# Patient Record
Sex: Male | Born: 1960 | Race: White | Hispanic: No | Marital: Married | Smoking: Former smoker
Health system: Southern US, Community
[De-identification: ages and names within clinical notes are randomized; demographics above are authoritative.]

## PROBLEM LIST (undated history)

## (undated) DIAGNOSIS — C801 Malignant (primary) neoplasm, unspecified: Secondary | ICD-10-CM

## (undated) DIAGNOSIS — F329 Major depressive disorder, single episode, unspecified: Secondary | ICD-10-CM

## (undated) DIAGNOSIS — J45909 Unspecified asthma, uncomplicated: Secondary | ICD-10-CM

## (undated) DIAGNOSIS — R519 Headache, unspecified: Secondary | ICD-10-CM

## (undated) DIAGNOSIS — N419 Inflammatory disease of prostate, unspecified: Secondary | ICD-10-CM

## (undated) DIAGNOSIS — I10 Essential (primary) hypertension: Secondary | ICD-10-CM

## (undated) DIAGNOSIS — K219 Gastro-esophageal reflux disease without esophagitis: Secondary | ICD-10-CM

## (undated) DIAGNOSIS — T7840XA Allergy, unspecified, initial encounter: Secondary | ICD-10-CM

## (undated) DIAGNOSIS — D8989 Other specified disorders involving the immune mechanism, not elsewhere classified: Secondary | ICD-10-CM

## (undated) DIAGNOSIS — Z87442 Personal history of urinary calculi: Secondary | ICD-10-CM

## (undated) DIAGNOSIS — Z973 Presence of spectacles and contact lenses: Secondary | ICD-10-CM

## (undated) DIAGNOSIS — B019 Varicella without complication: Secondary | ICD-10-CM

## (undated) DIAGNOSIS — F32A Depression, unspecified: Secondary | ICD-10-CM

## (undated) DIAGNOSIS — F419 Anxiety disorder, unspecified: Secondary | ICD-10-CM

## (undated) DIAGNOSIS — N189 Chronic kidney disease, unspecified: Secondary | ICD-10-CM

## (undated) HISTORY — DX: Inflammatory disease of prostate, unspecified: N41.9

## (undated) HISTORY — DX: Varicella without complication: B01.9

## (undated) HISTORY — DX: Anxiety disorder, unspecified: F41.9

## (undated) HISTORY — PX: LITHOTRIPSY: SUR834

## (undated) HISTORY — PX: UPPER GASTROINTESTINAL ENDOSCOPY: SHX188

## (undated) HISTORY — DX: Gastro-esophageal reflux disease without esophagitis: K21.9

## (undated) HISTORY — DX: Major depressive disorder, single episode, unspecified: F32.9

## (undated) HISTORY — DX: Chronic kidney disease, unspecified: N18.9

## (undated) HISTORY — DX: Malignant (primary) neoplasm, unspecified: C80.1

## (undated) HISTORY — DX: Depression, unspecified: F32.A

## (undated) HISTORY — DX: Allergy, unspecified, initial encounter: T78.40XA

## (undated) HISTORY — DX: Essential (primary) hypertension: I10

## (undated) HISTORY — DX: Unspecified asthma, uncomplicated: J45.909

## (undated) HISTORY — PX: OTHER SURGICAL HISTORY: SHX169

---

## 2000-10-13 LAB — HM COLONOSCOPY

## 2010-10-14 ENCOUNTER — Ambulatory Visit (INDEPENDENT_AMBULATORY_CARE_PROVIDER_SITE_OTHER): Payer: BC Managed Care – PPO | Admitting: Family Medicine

## 2010-10-14 ENCOUNTER — Encounter: Payer: Self-pay | Admitting: Family Medicine

## 2010-10-14 DIAGNOSIS — Z8659 Personal history of other mental and behavioral disorders: Secondary | ICD-10-CM

## 2010-10-14 DIAGNOSIS — Z800 Family history of malignant neoplasm of digestive organs: Secondary | ICD-10-CM

## 2010-10-14 DIAGNOSIS — N200 Calculus of kidney: Secondary | ICD-10-CM

## 2010-10-14 DIAGNOSIS — I10 Essential (primary) hypertension: Secondary | ICD-10-CM

## 2010-10-14 DIAGNOSIS — K219 Gastro-esophageal reflux disease without esophagitis: Secondary | ICD-10-CM

## 2010-10-14 MED ORDER — HYDROCODONE-IBUPROFEN 10-200 MG PO TABS: 1.0000 | ORAL_TABLET | Freq: Four times a day (QID) | ORAL | Status: DC

## 2010-10-14 MED ORDER — AMLODIPINE BESYLATE 10 MG PO TABS: 10.0000 mg | ORAL_TABLET | Freq: Every day | ORAL | Status: DC

## 2010-10-14 NOTE — Progress Notes (Signed)
Subjective:    Patient ID: Donald Clark, male    DOB: 1960/11/16, 50 y.o.   MRN: [id number]  HPI Patient seen to establish care. Past medical history reviewed. He has history of remote depression, GERD, hypertension, kidney stones, and recurrent prostatitis. Patient states he has a couple of large stones still present. He has intermittent bleeding (hematuria) especially with activities. Needs to establish with local urologist. He is not aware of any prior history of hydronephrosis. Do not have complete prior records at this time.  Strong family history of colon cancer both parents. Patient had colonoscopy 2002. Mother diagnosed age 48 and father at age 39. No polyps on previous colonoscopy. Needs to establish with local gastroenterologist.  History of recent weight gain. Avid cyclist in the past but no consistent exercise recently. Increased fatigue and increased body fat composition. Patient concern regarding possible low testosterone. Never checked. Libido is fair.  Family history significant for colon cancer in both parents as above. Mom had ovarian cancer and patient thinks this was metastatic from colon. Paternal grandmother and maternal grandfather with type 2 diabetes.  Patient is married with 2 children. Nonsmoker. Occasional alcohol use.  Past Medical History  Diagnosis Date  . Chicken pox   . Depression   . GERD (gastroesophageal reflux disease)   . Allergy   . Hypertension   . Chronic kidney disease     stones  . Prostatitis    History reviewed. No pertinent past surgical history.  reports that he quit smoking about 30 years ago. He does not have any smokeless tobacco history on file. His alcohol and drug histories not on file. family history includes Cancer in his mother; Diabetes in his maternal grandfather and paternal grandmother; and Hypertension in his mother. Allergies  Allergen Reactions  . Penicillins     GI upset  . Sulfa Antibiotics     Hives, joint pain       Review of Systems  Constitutional: Positive for fatigue and unexpected weight change (weight gain). Negative for fever, activity change and appetite change.  HENT: Negative for ear pain, congestion and trouble swallowing.   Eyes: Negative for pain and visual disturbance.  Respiratory: Negative for cough, shortness of breath and wheezing.   Cardiovascular: Negative for chest pain and palpitations.  Gastrointestinal: Negative for nausea, vomiting, abdominal pain, diarrhea, constipation, blood in stool, abdominal distention and rectal pain.  Genitourinary: Negative for dysuria, hematuria and testicular pain.  Musculoskeletal: Negative for joint swelling and arthralgias.  Skin: Negative for rash.  Neurological: Negative for dizziness, syncope and headaches.  Hematological: Negative for adenopathy.  Psychiatric/Behavioral: Negative for confusion and dysphoric mood.       Objective:   Physical Exam  Constitutional: He is oriented to person, place, and time. He appears well-developed and well-nourished. No distress.  HENT:  Right Ear: External ear normal.  Left Ear: External ear normal.  Mouth/Throat: Oropharynx is clear and moist. No oropharyngeal exudate.  Neck: Neck supple. No thyromegaly present.  Cardiovascular: Normal rate and regular rhythm.   No murmur heard. Pulmonary/Chest: Effort normal and breath sounds normal. No respiratory distress. He has no wheezes. He has no rales.  Abdominal: Soft. He exhibits no distension and no mass. There is no tenderness. There is no rebound and no guarding.  Musculoskeletal: He exhibits no edema.  Lymphadenopathy:    He has no cervical adenopathy.  Neurological: He is alert and oriented to person, place, and time. No cranial nerve deficit.  Skin: No rash noted.  Psychiatric: He has a normal mood and affect. His behavior is normal.          Assessment & Plan:  #1 hypertension stable continue current medication  #2 recent weight  gain. Add testosterone to typical labs for complete physical and schedule by next month #3 strong family history of colon cancer both parents. Schedule followup with local gastroenterologist for repeat colonoscopy #4 recurrent kidney stones. Intermittent gross hematuria. Schedule with local urologist. #5 history of depression currently stable #6 history of GERD symptoms relatively mild and controlled with over-the-counter antacids

## 2010-10-15 ENCOUNTER — Ambulatory Visit: Payer: BC Managed Care – PPO

## 2010-10-15 DIAGNOSIS — Z0389 Encounter for observation for other suspected diseases and conditions ruled out: Secondary | ICD-10-CM

## 2010-10-15 DIAGNOSIS — Z0000 Encounter for general adult medical examination without abnormal findings: Secondary | ICD-10-CM

## 2010-10-15 LAB — CBC WITH DIFFERENTIAL/PLATELET
Basophils Absolute: 0 10*3/uL (ref 0.0–0.1)
Basophils Relative: 0.8 % (ref 0.0–3.0)
Eosinophils Absolute: 0.1 10*3/uL (ref 0.0–0.7)
Eosinophils Relative: 2 % (ref 0.0–5.0)
HCT: 43.6 % (ref 39.0–52.0)
Hemoglobin: 14.8 g/dL (ref 13.0–17.0)
Lymphocytes Relative: 23.2 % (ref 12.0–46.0)
Lymphs Abs: 1 10*3/uL (ref 0.7–4.0)
MCHC: 34 g/dL (ref 30.0–36.0)
MCV: 85.5 fl (ref 78.0–100.0)
Monocytes Absolute: 0.4 10*3/uL (ref 0.1–1.0)
Monocytes Relative: 8.5 % (ref 3.0–12.0)
Neutro Abs: 2.9 10*3/uL (ref 1.4–7.7)
Neutrophils Relative %: 65.5 % (ref 43.0–77.0)
Platelets: 215 10*3/uL (ref 150.0–400.0)
RBC: 5.09 Mil/uL (ref 4.22–5.81)
RDW: 13 % (ref 11.5–14.6)
WBC: 4.5 10*3/uL (ref 4.5–10.5)

## 2010-10-15 LAB — URINALYSIS, ROUTINE W REFLEX MICROSCOPIC
Bilirubin Urine: NEGATIVE
Ketones, ur: NEGATIVE
Leukocytes, UA: NEGATIVE
Nitrite: NEGATIVE
Specific Gravity, Urine: 1.02 (ref 1.000–1.030)
Total Protein, Urine: 100
Urine Glucose: NEGATIVE
Urobilinogen, UA: 0.2 (ref 0.0–1.0)
pH: 7 (ref 5.0–8.0)

## 2010-10-15 LAB — BASIC METABOLIC PANEL
BUN: 19 mg/dL (ref 6–23)
CO2: 29 mEq/L (ref 19–32)
Calcium: 9.5 mg/dL (ref 8.4–10.5)
Chloride: 109 mEq/L (ref 96–112)
Creatinine, Ser: 1 mg/dL (ref 0.4–1.5)
GFR: 84.78 mL/min (ref >60.00)
Glucose, Bld: 90 mg/dL (ref 70–99)
Potassium: 4.4 mEq/L (ref 3.5–5.1)
Sodium: 143 mEq/L (ref 135–145)

## 2010-10-15 LAB — HEPATIC FUNCTION PANEL
ALT: 24 U/L (ref 0–53)
AST: 22 U/L (ref 0–37)
Albumin: 4.5 g/dL (ref 3.5–5.2)
Alkaline Phosphatase: 69 U/L (ref 39–117)
Bilirubin, Direct: 0.1 mg/dL (ref 0.0–0.3)
Total Bilirubin: 0.7 mg/dL (ref 0.3–1.2)
Total Protein: 6.8 g/dL (ref 6.0–8.3)

## 2010-10-15 LAB — LIPID PANEL
Cholesterol: 174 mg/dL (ref 0–200)
HDL: 50.8 mg/dL (ref >39.00)
LDL Cholesterol: 108 mg/dL — ABNORMAL HIGH (ref 0–99)
Total CHOL/HDL Ratio: 3
Triglycerides: 75 mg/dL (ref 0.0–149.0)
VLDL: 15 mg/dL (ref 0.0–40.0)

## 2010-10-15 LAB — TSH: TSH: 1.33 u[IU]/mL (ref 0.35–5.50)

## 2010-10-15 LAB — PSA: PSA: 0.75 ng/mL (ref 0.10–4.00)

## 2010-11-13 ENCOUNTER — Ambulatory Visit (INDEPENDENT_AMBULATORY_CARE_PROVIDER_SITE_OTHER): Payer: BC Managed Care – PPO | Admitting: Family Medicine

## 2010-11-13 ENCOUNTER — Encounter: Payer: Self-pay | Admitting: Family Medicine

## 2010-11-13 VITALS — BP 124/90 | HR 72 | Temp 98.5°F | Resp 12 | Ht 66.5 in | Wt 179.0 lb

## 2010-11-13 DIAGNOSIS — L989 Disorder of the skin and subcutaneous tissue, unspecified: Secondary | ICD-10-CM

## 2010-11-13 DIAGNOSIS — Z0000 Encounter for general adult medical examination without abnormal findings: Secondary | ICD-10-CM

## 2010-11-13 DIAGNOSIS — R5383 Other fatigue: Secondary | ICD-10-CM

## 2010-11-13 DIAGNOSIS — R5381 Other malaise: Secondary | ICD-10-CM

## 2010-11-13 MED ORDER — ZOLPIDEM TARTRATE 10 MG PO TABS: 10.0000 mg | ORAL_TABLET | Freq: Every evening | ORAL | Status: DC | PRN

## 2010-11-13 MED ORDER — FLUTICASONE PROPIONATE 50 MCG/ACT NA SUSP: 2.0000 | Freq: Every day | NASAL | Status: DC

## 2010-11-13 NOTE — Patient Instructions (Signed)
Schedule testosterone level We will call you regarding appointment for [facility] Cialis 5 mg one daily

## 2010-11-13 NOTE — Progress Notes (Signed)
Subjective:    Patient ID: Donald Clark, male    DOB: 12/19/1960, 50 y.o.   MRN: [id number]  HPI Patient here for complete physical. Has history of kidney stones and scheduled lithotripsy this Monday. Other medical problems include history of hypertension, GERD, and past history of depression. He takes amlodipine for hypertension. Blood pressure generally well controlled. Intermittent insomnia takes Ambien for that. Seasonal allergies and has used Flonase and requesting refills.  Has had difficulty losing weight in recent years. Still exercises regularly. Increased body fat. Increased fatigue. Occasional erectile dysfunction. He requested testosterone level be checked-but this was not drawn. Also has occasional slow urinary stream. Requesting trial of Cialis.  Tetanus up-to-date. Flu vaccine through employer.  Family history significant for both parents with colon cancer. Patient had last colonoscopy about 10 years ago and has been contacted by gastroenterology and plans to schedule colonoscopy after upcoming lithotripsy.  Past Medical History  Diagnosis Date  . Chicken pox   . Depression   . GERD (gastroesophageal reflux disease)   . Allergy   . Hypertension   . Chronic kidney disease     stones  . Prostatitis    No past surgical history on file.  reports that he quit smoking about 30 years ago. He does not have any smokeless tobacco history on file. His alcohol and drug histories not on file. family history includes Cancer in his mother; Cancer (age of onset:63) in his father; Diabetes in his maternal grandfather and paternal grandmother; and Hypertension in his mother. Allergies  Allergen Reactions  . Penicillins     GI upset  . Sulfa Antibiotics     Hives, joint pain      Review of Systems  Constitutional: Positive for fatigue. Negative for fever, activity change and appetite change.  HENT: Negative for ear pain, congestion and trouble swallowing.   Eyes: Negative for pain  and visual disturbance.  Respiratory: Negative for cough, shortness of breath and wheezing.   Cardiovascular: Negative for chest pain and palpitations.  Gastrointestinal: Negative for nausea, vomiting, abdominal pain, diarrhea, constipation, blood in stool, abdominal distention and rectal pain.  Genitourinary: Negative for dysuria, hematuria and testicular pain.  Musculoskeletal: Negative for joint swelling and arthralgias.  Skin: Positive for rash.  Neurological: Negative for dizziness, syncope and headaches.  Hematological: Negative for adenopathy.  Psychiatric/Behavioral: Negative for confusion and dysphoric mood.       Objective:   Physical Exam  Constitutional: He is oriented to person, place, and time. He appears well-developed and well-nourished.  HENT:  Head: Normocephalic and atraumatic.  Right Ear: External ear normal.  Left Ear: External ear normal.  Mouth/Throat: Oropharynx is clear and moist.  Eyes: Pupils are equal, round, and reactive to light.  Neck: Neck supple. No thyromegaly present.  Cardiovascular: Normal rate, regular rhythm and normal heart sounds.   No murmur heard. Pulmonary/Chest: Effort normal and breath sounds normal. No respiratory distress. He has no wheezes. He has no rales.  Abdominal: Soft. Bowel sounds are normal. He exhibits no distension. There is no tenderness. There is no rebound and no guarding.  Genitourinary:       Prostate deferred as he is seeing urologist regularly  Musculoskeletal: He exhibits no edema.  Lymphadenopathy:    He has no cervical adenopathy.  Neurological: He is alert and oriented to person, place, and time. No cranial nerve deficit.  Skin:       Right upper back reveals slightly raised skin lesion which is approximately 15 by  8 mm. Slightly erythematous base. Few telangiectasias. No pigment  Psychiatric: He has a normal mood and affect. His behavior is normal. Judgment and thought content normal.          Assessment  & Plan:  #1 health maintenance. Labs reviewed with patient with no concerns. Tetanus up-to-date. Continue yearly flu vaccine #2 fatigue and decreased libido. Check early morning testosterone level. Trial Cialis 5 mg daily  #3 skin lesion right upper back.  This is a new problem.  Possible basal cell carcinoma. Referral skin surgery Center #4 hypertension stable continue amlodipine 10 mg daily #5 insomnia, transient. Refill Ambien for as needed use #6 family history of colon cancer both parents. Patient will schedule colonoscopy after lithotripsy

## 2010-11-17 ENCOUNTER — Ambulatory Visit (HOSPITAL_COMMUNITY)
Admission: RE | Admit: 2010-11-17 | Discharge: 2010-11-17 | Disposition: A | Discharge status: Home / Self Care | Payer: BC Managed Care – PPO | Source: Ambulatory Visit | Attending: Urology | Admitting: Urology

## 2010-11-17 DIAGNOSIS — K219 Gastro-esophageal reflux disease without esophagitis: Secondary | ICD-10-CM | POA: Insufficient documentation

## 2010-11-17 DIAGNOSIS — I10 Essential (primary) hypertension: Secondary | ICD-10-CM | POA: Insufficient documentation

## 2010-11-17 DIAGNOSIS — N200 Calculus of kidney: Secondary | ICD-10-CM | POA: Insufficient documentation

## 2010-12-08 ENCOUNTER — Encounter: Payer: Self-pay | Admitting: Gastroenterology

## 2010-12-29 ENCOUNTER — Ambulatory Visit (HOSPITAL_COMMUNITY)
Admission: RE | Admit: 2010-12-29 | Discharge: 2010-12-29 | Disposition: A | Discharge status: Home / Self Care | Payer: BC Managed Care – PPO | Source: Ambulatory Visit | Attending: Urology | Admitting: Urology

## 2010-12-29 DIAGNOSIS — N200 Calculus of kidney: Secondary | ICD-10-CM | POA: Insufficient documentation

## 2010-12-29 DIAGNOSIS — Z01818 Encounter for other preprocedural examination: Secondary | ICD-10-CM | POA: Insufficient documentation

## 2010-12-30 ENCOUNTER — Encounter: Payer: Self-pay | Admitting: Gastroenterology

## 2010-12-30 ENCOUNTER — Ambulatory Visit (AMBULATORY_SURGERY_CENTER): Payer: BC Managed Care – PPO

## 2010-12-30 VITALS — Ht 67.0 in | Wt 177.3 lb

## 2010-12-30 DIAGNOSIS — Z800 Family history of malignant neoplasm of digestive organs: Secondary | ICD-10-CM

## 2010-12-30 MED ORDER — PEG-KCL-NACL-NASULF-NA ASC-C 100 G PO SOLR: 1.0000 | Freq: Once | ORAL | Status: AC

## 2010-12-30 NOTE — Progress Notes (Signed)
Pt came into office today for his pre-visit prior to his colonoscopy on 01/13/11 with Dr Christella Hartigan. Pt had a colonoscopy done 10 years ago in [place], [place] but doesn't remember the doctors name or results. There is a family hx of colon cancer. Ulis Rias RN

## 2011-02-05 ENCOUNTER — Other Ambulatory Visit: Payer: BC Managed Care – PPO

## 2011-02-12 ENCOUNTER — Telehealth: Payer: Self-pay | Admitting: Gastroenterology

## 2011-02-12 NOTE — Telephone Encounter (Signed)
No charge as long as he reschedules. Thanks

## 2011-02-13 ENCOUNTER — Ambulatory Visit (INDEPENDENT_AMBULATORY_CARE_PROVIDER_SITE_OTHER): Payer: BC Managed Care – PPO | Admitting: Family Medicine

## 2011-02-13 ENCOUNTER — Encounter: Payer: Self-pay | Admitting: Family Medicine

## 2011-02-13 ENCOUNTER — Other Ambulatory Visit: Payer: BC Managed Care – PPO | Admitting: Gastroenterology

## 2011-02-13 VITALS — BP 126/90 | HR 114 | Temp 99.2°F | Wt 176.0 lb

## 2011-02-13 DIAGNOSIS — H1045 Other chronic allergic conjunctivitis: Secondary | ICD-10-CM

## 2011-02-13 DIAGNOSIS — H1013 Acute atopic conjunctivitis, bilateral: Secondary | ICD-10-CM

## 2011-02-13 DIAGNOSIS — N400 Enlarged prostate without lower urinary tract symptoms: Secondary | ICD-10-CM

## 2011-02-13 MED ORDER — AZELASTINE HCL 0.05 % OP SOLN: 1.0000 [drp] | Freq: Two times a day (BID) | OPHTHALMIC | Status: DC

## 2011-02-13 MED ORDER — TADALAFIL 5 MG PO TABS: 5.0000 mg | ORAL_TABLET | Freq: Every day | ORAL | Status: DC

## 2011-02-13 MED ORDER — CETIRIZINE HCL 10 MG PO TABS: 10.0000 mg | ORAL_TABLET | Freq: Every day | ORAL | Status: DC

## 2011-02-13 NOTE — Patient Instructions (Signed)

## 2011-02-13 NOTE — Progress Notes (Signed)
Subjective:    Patient ID: Donald Clark, male    DOB: August 02, 1960, 50 y.o.   MRN: [id number]  HPI 50 year old white male, former smoker, and with a one-day history of itchy eyes redness and burning. Has had some nasal congestion and nonproductive cough. His knees have not seen for relief has not working well. Denies any matting or crusting, no headaches, lightheadedness, or dizziness.  He has a history of BPH and erectile dysfunction. He typically takes Cialis 5 mg daily. He is requesting a sample pack. He is tolerating medications well. Denies any frequency or urgency to urinate, no burning with urination, back pain, nausea, vomiting, or edema.  Review of Systems As stated above    Past Medical History  Diagnosis Date  . Chicken pox   . Depression   . GERD (gastroesophageal reflux disease)   . Allergy   . Hypertension   . Chronic kidney disease     stones  . Prostatitis     History   Social History  . Marital Status: Married    Spouse Name: N/A    Number of Children: N/A  . Years of Education: N/A   Occupational History  . Not on file.   Social History Main Topics  . Smoking status: Former Smoker -- 0.5 packs/day for 4 years    Types: Cigarettes    Quit date: 10/13/1980  . Smokeless tobacco: Never Used  . Alcohol Use: 0.5 oz/week    1 drink(s) per week  . Drug Use: No  . Sexually Active: Not on file   Other Topics Concern  . Not on file   Social History Narrative  . No narrative on file    Past Surgical History  Procedure Date  . Upper gastrointestinal endoscopy   . Colonoscopy   . Lithotripsy   . Skin cancer removed     on ankle, back neck    Family History  Problem Relation Age of Onset  . Cancer Mother     colon, uterine CA  . Hypertension Mother   . Colon cancer Mother   . Diabetes Maternal Grandfather   . Diabetes Paternal Grandmother   . Cancer Father 59    colon cancer  . Colon cancer Father     Allergies  Allergen Reactions  .  Penicillins     GI upset  . Sulfa Antibiotics     Hives, joint pain    Current Outpatient Prescriptions on File Prior to Visit  Medication Sig Dispense Refill  . amLODipine (NORVASC) 10 MG tablet Take 1 tablet (10 mg total) by mouth daily.  30 tablet  11  . fluticasone (FLONASE) 50 MCG/ACT nasal spray Place 2 sprays into the nose daily.  16 g  11  . HYDROcodone-acetaminophen (VICODIN) 5-500 MG per tablet Take 1 tablet by mouth every 6 (six) hours as needed.        . Hydrocodone-Ibuprofen 10-200 MG TABS Take 1 tablet by mouth QID.  20 each  0  . zolpidem (AMBIEN) 10 MG tablet Take 1 tablet (10 mg total) by mouth at bedtime as needed.  30 tablet  6    BP 126/90  Pulse 114  Temp(Src) 99.2 F (37.3 C) (Oral)  Wt 176 lb (79.833 kg)chart Objective:   Physical Exam Constitutional: Alert and oriented in no acute distress HEENT pupils equal and reactive to light and accommodation, conjunctiva slightly red and injected. Ear and normal pharynx normal normal dentition Neck: No lymphadenopathy, no thyromegaly masses or bruits Lungs:  Clear to auscultation Cardiac regular rate and rhythm, no murmurs rubs or gallops  Skin: Warm and dry, no cyanosis Psychiatric: Normal mood and affect       Assessment & Plan:  Assessment: Allergic conjunctivitis, benign prostatic hypertrophy, erectile dysfunction  Plan: Zyrtec over-the-counter when necessary, Optivar one drop in each eye daily. Cialis 5 mg once daily. Patient to call if symptoms worsen or persist recheck as scheduled, and when necessary

## 2011-03-05 ENCOUNTER — Telehealth: Payer: Self-pay | Admitting: Gastroenterology

## 2011-03-05 NOTE — Telephone Encounter (Signed)
No charge. 

## 2011-03-09 ENCOUNTER — Other Ambulatory Visit: Payer: BC Managed Care – PPO | Admitting: Gastroenterology

## 2011-03-09 ENCOUNTER — Encounter: Payer: Self-pay | Admitting: Gastroenterology

## 2011-03-09 NOTE — Telephone Encounter (Signed)
error 

## 2011-03-27 ENCOUNTER — Ambulatory Visit (AMBULATORY_SURGERY_CENTER): Payer: BC Managed Care – PPO | Admitting: Gastroenterology

## 2011-03-27 ENCOUNTER — Encounter: Payer: Self-pay | Admitting: Gastroenterology

## 2011-03-27 DIAGNOSIS — Z800 Family history of malignant neoplasm of digestive organs: Secondary | ICD-10-CM

## 2011-03-27 DIAGNOSIS — Z1211 Encounter for screening for malignant neoplasm of colon: Secondary | ICD-10-CM

## 2011-03-27 MED ORDER — SODIUM CHLORIDE 0.9 % IV SOLN: 500.0000 mL | INTRAVENOUS | Status: DC

## 2011-03-27 NOTE — Patient Instructions (Signed)
FOLLOW THE DISCHARGE INSTRUCTIONS ON THE BLUE AND GREEN INSTRUCTION SHEETS.  CONTINUE YOUR MEDICATIONS.  NEXT COLONOSCOPY IN 5 YEARS, 2018.

## 2011-03-27 NOTE — Progress Notes (Signed)
Pressure applied to abdomen to reach cecum.  

## 2011-03-27 NOTE — Op Note (Signed)
[facility] [address] [place], [place]  [address]  COLONOSCOPY PROCEDURE REPORT  PATIENT:  Donald Clark, Donald Clark  MR#:  [id number] BIRTHDATE:  1960/11/01, 50 yrs. old  GENDER:  male ENDOSCOPIST:  Rachael Fee, MD REF. BY:  Evelena Peat, M.D. PROCEDURE DATE:  03/27/2011 PROCEDURE:  Colonoscopy 62[address] ASA CLASS:  Class II INDICATIONS:  Elevated Risk Screening mother and father both with colorectal cancer MEDICATIONS:   Fentanyl 100 mcg IV, These medications were titrated to patient response per physician's verbal order, Versed 10 mg IV  DESCRIPTION OF PROCEDURE:   After the risks benefits and alternatives of the procedure were thoroughly explained, informed consent was obtained.  Digital rectal exam was performed and revealed no rectal masses.   The LB 180AL [id number] endoscope was introduced through the anus and advanced to the cecum, which was identified by both the appendix and ileocecal valve, without limitations.  The quality of the prep was good..  The instrument was then slowly withdrawn as the colon was fully examined. <<PROCEDUREIMAGES>> FINDINGS:  Internal Hemorrhoids were found.  This was otherwise a normal examination of the colon (see image1, image2, and image3). Retroflexed views in the rectum revealed no abnormalities. COMPLICATIONS:  None  ENDOSCOPIC IMPRESSION: 1) Internal hemorrhoids 2) Otherwise normal examination; no polyps or cancers  RECOMMENDATIONS: 1) Given your significant family history of colon cancer, you should have a repeat colonoscopy in 5 years  REPEAT EXAM:  5 years  ______________________________ Rachael Fee, MD  n. eSIGNED:   Rachael Fee at 03/27/2011 02:01 PM  Jobe Marker, [phone]

## 2011-03-27 NOTE — Progress Notes (Signed)
Awoke on coming out of cecum and explained that this section of the colon has a lot of nerves, tolerating well.

## 2011-03-27 NOTE — Progress Notes (Signed)
Patient did not experience any of the following events: a burn prior to discharge; a fall within the facility; wrong site/side/patient/procedure/implant event; or a hospital transfer or hospital admission upon discharge from the facility. (G8907) Patient did not have preoperative order for IV antibiotic SSI prophylaxis. (G8918)  

## 2011-03-30 ENCOUNTER — Telehealth: Payer: Self-pay | Admitting: *Deleted

## 2011-03-30 NOTE — Telephone Encounter (Signed)
NO ANSWER, MESSAGE LEFT WITH THE PATIENT.

## 2011-10-19 ENCOUNTER — Other Ambulatory Visit: Payer: Self-pay | Admitting: Family Medicine

## 2011-12-02 ENCOUNTER — Telehealth: Payer: Self-pay | Admitting: Family Medicine

## 2011-12-02 MED ORDER — AMLODIPINE BESYLATE 10 MG PO TABS: 10.0000 mg | ORAL_TABLET | Freq: Every day | ORAL | Status: DC

## 2011-12-02 NOTE — Telephone Encounter (Signed)
Pt has made appt for physical on 10/31. He will not have enough Amlodipine to get to that appt. Wondering if he can get 2wks worth sent to [facility] on [address] to get him through to physical. Thanks.

## 2011-12-08 ENCOUNTER — Ambulatory Visit (INDEPENDENT_AMBULATORY_CARE_PROVIDER_SITE_OTHER): Payer: BC Managed Care – PPO | Admitting: Family Medicine

## 2011-12-08 VITALS — BP 130/88 | Temp 98.5°F | Wt 170.0 lb

## 2011-12-08 DIAGNOSIS — F339 Major depressive disorder, recurrent, unspecified: Secondary | ICD-10-CM

## 2011-12-08 MED ORDER — LORAZEPAM 0.5 MG PO TABS: 0.5000 mg | ORAL_TABLET | Freq: Three times a day (TID) | ORAL | Status: DC | PRN

## 2011-12-08 MED ORDER — BUPROPION HCL ER (SR) 100 MG PO TB12: 100.0000 mg | ORAL_TABLET | Freq: Two times a day (BID) | ORAL | Status: DC

## 2011-12-08 NOTE — Progress Notes (Signed)
  Subjective:    Patient ID: Donald Clark, male    DOB: [birth year], 51 y.o.   MRN: [id number]  HPI  Patient is seen for issues regarding recurrent depression. Has been treated several times in the past. Off medication for several years now. Has had best success with Wellbutrin SR. Generally has taken 100 mg twice daily. He's had some recent issues with stress of dealing with 57 year old daughter. No suicidal ideation. He has some early morning awakening and decreased motivation and decreased appetite. Had some weight loss about 8 pounds in the past few weeks. Previously was treated with Effexor and Prozac but had sexual side effects and felt more anxious with these. He does not have any history of agitation. At one point, question of bipolar but this has never been confirmed. He has tolerated Wellbutrin in the past without triggering any mania and again this diagnosis was never confirmed.   Review of Systems  Constitutional: Negative for fever and chills.  Respiratory: Negative for shortness of breath.   Cardiovascular: Negative for chest pain.  Neurological: Negative for dizziness and headaches.  Psychiatric/Behavioral: Positive for dysphoric mood. Negative for agitation.       Objective:   Physical Exam  Constitutional: He appears well-developed and well-nourished.  Cardiovascular: Normal rate and regular rhythm.   Pulmonary/Chest: Effort normal and breath sounds normal. No respiratory distress. He has no wheezes. He has no rales.  Psychiatric: He has a normal mood and affect. His behavior is normal. Thought content normal.          Assessment & Plan:  Recurrent depression. Patient has responded well in the past to Wellbutrin. Wellbutrin SR 100 mg daily for one week then titrate to twice daily. Reassess at complete physical in 3 weeks

## 2011-12-21 ENCOUNTER — Other Ambulatory Visit (INDEPENDENT_AMBULATORY_CARE_PROVIDER_SITE_OTHER): Payer: BC Managed Care – PPO

## 2011-12-21 DIAGNOSIS — Z0000 Encounter for general adult medical examination without abnormal findings: Secondary | ICD-10-CM

## 2011-12-21 LAB — BASIC METABOLIC PANEL
BUN: 15 mg/dL (ref 6–23)
CO2: 28 mEq/L (ref 19–32)
Calcium: 9.3 mg/dL (ref 8.4–10.5)
Chloride: 102 mEq/L (ref 96–112)
Creatinine, Ser: 1 mg/dL (ref 0.4–1.5)
GFR: 86.4 mL/min (ref >60.00)
Glucose, Bld: 77 mg/dL (ref 70–99)
Potassium: 3.8 mEq/L (ref 3.5–5.1)
Sodium: 138 mEq/L (ref 135–145)

## 2011-12-21 LAB — HEPATIC FUNCTION PANEL
ALT: 17 U/L (ref 0–53)
AST: 20 U/L (ref 0–37)
Albumin: 4.6 g/dL (ref 3.5–5.2)
Alkaline Phosphatase: 54 U/L (ref 39–117)
Bilirubin, Direct: 0.1 mg/dL (ref 0.0–0.3)
Total Bilirubin: 0.7 mg/dL (ref 0.3–1.2)
Total Protein: 7 g/dL (ref 6.0–8.3)

## 2011-12-21 LAB — POCT URINALYSIS DIPSTICK
Bilirubin, UA: NEGATIVE
Blood, UA: NEGATIVE
Glucose, UA: NEGATIVE
Ketones, UA: NEGATIVE
Leukocytes, UA: NEGATIVE
Nitrite, UA: NEGATIVE
Protein, UA: NEGATIVE
Spec Grav, UA: 1.02
Urobilinogen, UA: 0.2
pH, UA: 6

## 2011-12-21 LAB — LIPID PANEL
Cholesterol: 153 mg/dL (ref 0–200)
HDL: 50.5 mg/dL (ref >39.00)
LDL Cholesterol: 89 mg/dL (ref 0–99)
Total CHOL/HDL Ratio: 3
Triglycerides: 68 mg/dL (ref 0.0–149.0)
VLDL: 13.6 mg/dL (ref 0.0–40.0)

## 2011-12-21 LAB — CBC WITH DIFFERENTIAL/PLATELET
Basophils Absolute: 0 10*3/uL (ref 0.0–0.1)
Basophils Relative: 0.6 % (ref 0.0–3.0)
Eosinophils Absolute: 0 10*3/uL (ref 0.0–0.7)
Eosinophils Relative: 0.7 % (ref 0.0–5.0)
HCT: 48.2 % (ref 39.0–52.0)
Hemoglobin: 15.9 g/dL (ref 13.0–17.0)
Lymphocytes Relative: 17.6 % (ref 12.0–46.0)
Lymphs Abs: 1 10*3/uL (ref 0.7–4.0)
MCHC: 33 g/dL (ref 30.0–36.0)
MCV: 87 fl (ref 78.0–100.0)
Monocytes Absolute: 0.4 10*3/uL (ref 0.1–1.0)
Monocytes Relative: 7.4 % (ref 3.0–12.0)
Neutro Abs: 4.1 10*3/uL (ref 1.4–7.7)
Neutrophils Relative %: 73.7 % (ref 43.0–77.0)
Platelets: 260 10*3/uL (ref 150.0–400.0)
RBC: 5.55 Mil/uL (ref 4.22–5.81)
RDW: 12.9 % (ref 11.5–14.6)
WBC: 5.5 10*3/uL (ref 4.5–10.5)

## 2011-12-21 LAB — TESTOSTERONE: Testosterone: 304.88 ng/dL — ABNORMAL LOW (ref 350.00–890.00)

## 2011-12-21 LAB — TSH: TSH: 1.06 u[IU]/mL (ref 0.35–5.50)

## 2011-12-21 LAB — PSA: PSA: 0.59 ng/mL (ref 0.10–4.00)

## 2011-12-31 ENCOUNTER — Ambulatory Visit (INDEPENDENT_AMBULATORY_CARE_PROVIDER_SITE_OTHER): Payer: BC Managed Care – PPO | Admitting: Family Medicine

## 2011-12-31 ENCOUNTER — Encounter: Payer: Self-pay | Admitting: Family Medicine

## 2011-12-31 VITALS — BP 120/82 | HR 72 | Temp 98.5°F | Resp 12 | Ht 66.0 in | Wt 162.0 lb

## 2011-12-31 DIAGNOSIS — Z0000 Encounter for general adult medical examination without abnormal findings: Secondary | ICD-10-CM

## 2011-12-31 MED ORDER — ZOLPIDEM TARTRATE 10 MG PO TABS: 10.0000 mg | ORAL_TABLET | Freq: Every evening | ORAL | Status: DC | PRN

## 2011-12-31 NOTE — Progress Notes (Signed)
Subjective:    Patient ID: Donald Clark, male    DOB: 1960/12/23, 51 y.o.   MRN: [id number]  HPI  Patient here for complete physical. He has history of hypertension, GERD, depression history. Tetanus up to date. Flu vaccine given. Colonoscopy earlier this year. Positive family history of colon cancer in parents.  Recently started Wellbutrin for mood disorder. He has seen some improvement. Occasional insomnia. Takes Ambien as needed. History of BPH. Previous has taken daily Cialis 5 mg which helps but has had problems with insurance coverage. Patient's lost 17 pounds since last year due to his efforts and overall feels well. He does have some fatigue issues and possible decreased muscle mass he was concerned about low testosterone. She's had low libido off and on.  Past Medical History  Diagnosis Date  . Chicken pox   . Depression   . GERD (gastroesophageal reflux disease)   . Allergy   . Hypertension   . Chronic kidney disease     stones  . Prostatitis   . Cancer     skin- multiple removed-1 basal cell, 1 squamous cell   Past Surgical History  Procedure Date  . Upper gastrointestinal endoscopy   . Colonoscopy   . Lithotripsy   . Skin cancer removed     on ankle, back neck    reports that he quit smoking about 31 years ago. His smoking use included Cigarettes. He has a 2 pack-year smoking history. He has never used smokeless tobacco. He reports that he does not drink alcohol or use illicit drugs. family history includes Cancer in his mother; Cancer (age of onset:63) in his father; Colon cancer in his mother; Diabetes in his maternal grandfather and paternal grandmother; Hypertension in his mother; and Rectal cancer in his father.  There is no history of Esophageal cancer and Stomach cancer. Allergies  Allergen Reactions  . Penicillins     GI upset  . Sulfa Antibiotics     Hives, joint pain     Review of Systems  Constitutional: Negative for fever, activity change, appetite  change and fatigue.  HENT: Negative for ear pain, congestion and trouble swallowing.   Eyes: Negative for pain and visual disturbance.  Respiratory: Negative for cough, shortness of breath and wheezing.   Cardiovascular: Negative for chest pain and palpitations.  Gastrointestinal: Negative for nausea, vomiting, abdominal pain, diarrhea, constipation, blood in stool, abdominal distention and rectal pain.  Genitourinary: Negative for dysuria, hematuria and testicular pain.  Musculoskeletal: Negative for joint swelling and arthralgias.  Skin: Negative for rash.  Neurological: Negative for dizziness, syncope and headaches.  Hematological: Negative for adenopathy.  Psychiatric/Behavioral: Negative for confusion and dysphoric mood.       Objective:   Physical Exam  Constitutional: He is oriented to person, place, and time. He appears well-developed and well-nourished. No distress.  HENT:  Head: Normocephalic and atraumatic.  Right Ear: External ear normal.  Left Ear: External ear normal.  Mouth/Throat: Oropharynx is clear and moist.  Eyes: Conjunctivae normal and EOM are normal. Pupils are equal, round, and reactive to light.  Neck: Normal range of motion. Neck supple. No thyromegaly present.  Cardiovascular: Normal rate, regular rhythm and normal heart sounds.   No murmur heard. Pulmonary/Chest: No respiratory distress. He has no wheezes. He has no rales.  Abdominal: Soft. Bowel sounds are normal. He exhibits no distension and no mass. There is no tenderness. There is no rebound and no guarding.  Musculoskeletal: He exhibits no edema.  Lymphadenopathy:  He has no cervical adenopathy.  Neurological: He is alert and oriented to person, place, and time. He displays normal reflexes. No cranial nerve deficit.  Skin: No rash noted.  Psychiatric: He has a normal mood and affect.          Assessment & Plan:  Physical exam. Labs reviewed. Mildly low total testosterone. Discussed pros  and cons of treatment and at this point he wishes to wait. Increase exercise. Refill Ambien #30 with no further refills. Use sparingly. Colonoscopy up to date. Continue every 5 years with positive family history

## 2012-03-23 ENCOUNTER — Other Ambulatory Visit: Payer: Self-pay | Admitting: Family Medicine

## 2012-09-04 ENCOUNTER — Emergency Department (HOSPITAL_COMMUNITY): Payer: BC Managed Care – PPO

## 2012-09-04 ENCOUNTER — Encounter (HOSPITAL_COMMUNITY): Payer: Self-pay | Admitting: *Deleted

## 2012-09-04 ENCOUNTER — Observation Stay (HOSPITAL_COMMUNITY)
Admission: EM | Admit: 2012-09-04 | Discharge: 2012-09-05 | Disposition: A | Discharge status: Home / Self Care | Payer: BC Managed Care – PPO | Attending: Internal Medicine | Admitting: Internal Medicine

## 2012-09-04 DIAGNOSIS — S61451A Open bite of right hand, initial encounter: Secondary | ICD-10-CM

## 2012-09-04 DIAGNOSIS — W540XXA Bitten by dog, initial encounter: Secondary | ICD-10-CM | POA: Insufficient documentation

## 2012-09-04 DIAGNOSIS — Z79899 Other long term (current) drug therapy: Secondary | ICD-10-CM | POA: Insufficient documentation

## 2012-09-04 DIAGNOSIS — IMO0002 Reserved for concepts with insufficient information to code with codable children: Secondary | ICD-10-CM | POA: Insufficient documentation

## 2012-09-04 DIAGNOSIS — I10 Essential (primary) hypertension: Secondary | ICD-10-CM | POA: Insufficient documentation

## 2012-09-04 DIAGNOSIS — S61409A Unspecified open wound of unspecified hand, initial encounter: Secondary | ICD-10-CM

## 2012-09-04 DIAGNOSIS — S61459A Open bite of unspecified hand, initial encounter: Secondary | ICD-10-CM

## 2012-09-04 DIAGNOSIS — R079 Chest pain, unspecified: Principal | ICD-10-CM | POA: Insufficient documentation

## 2012-09-04 DIAGNOSIS — R0602 Shortness of breath: Secondary | ICD-10-CM | POA: Insufficient documentation

## 2012-09-04 DIAGNOSIS — K219 Gastro-esophageal reflux disease without esophagitis: Secondary | ICD-10-CM

## 2012-09-04 LAB — CBC WITH DIFFERENTIAL/PLATELET
Basophils Absolute: 0 10*3/uL (ref 0.0–0.1)
Basophils Relative: 0 % (ref 0–1)
Eosinophils Absolute: 0 10*3/uL (ref 0.0–0.7)
Eosinophils Relative: 0 % (ref 0–5)
HCT: 42.3 % (ref 39.0–52.0)
Hemoglobin: 15.2 g/dL (ref 13.0–17.0)
Lymphocytes Relative: 5 % — ABNORMAL LOW (ref 12–46)
Lymphs Abs: 0.7 10*3/uL (ref 0.7–4.0)
MCH: 29.2 pg (ref 26.0–34.0)
MCHC: 35.9 g/dL (ref 30.0–36.0)
MCV: 81.2 fL (ref 78.0–100.0)
Monocytes Absolute: 0.8 10*3/uL (ref 0.1–1.0)
Monocytes Relative: 5 % (ref 3–12)
Neutro Abs: 12.6 10*3/uL — ABNORMAL HIGH (ref 1.7–7.7)
Neutrophils Relative %: 90 % — ABNORMAL HIGH (ref 43–77)
Platelets: 172 10*3/uL (ref 150–400)
RBC: 5.21 MIL/uL (ref 4.22–5.81)
RDW: 12.2 % (ref 11.5–15.5)
WBC: 14.1 10*3/uL — ABNORMAL HIGH (ref 4.0–10.5)

## 2012-09-04 LAB — CBC
HCT: 43.4 % (ref 39.0–52.0)
Hemoglobin: 15.4 g/dL (ref 13.0–17.0)
MCH: 29.2 pg (ref 26.0–34.0)
MCHC: 35.5 g/dL (ref 30.0–36.0)
MCV: 82.2 fL (ref 78.0–100.0)
Platelets: 165 10*3/uL (ref 150–400)
RBC: 5.28 MIL/uL (ref 4.22–5.81)
RDW: 12.5 % (ref 11.5–15.5)
WBC: 12 10*3/uL — ABNORMAL HIGH (ref 4.0–10.5)

## 2012-09-04 LAB — CREATININE, SERUM
Creatinine, Ser: 1.3 mg/dL (ref 0.50–1.35)
GFR calc Af Amer: 71 mL/min — ABNORMAL LOW (ref >90)
GFR calc non Af Amer: 62 mL/min — ABNORMAL LOW (ref >90)

## 2012-09-04 LAB — TROPONIN I: Troponin I: <0.3 ng/mL (ref <0.30)

## 2012-09-04 LAB — POCT I-STAT, CHEM 8
BUN: 12 mg/dL (ref 6–23)
Calcium, Ion: 1.21 mmol/L (ref 1.12–1.23)
Chloride: 106 mEq/L (ref 96–112)
Creatinine, Ser: 1.2 mg/dL (ref 0.50–1.35)
Glucose, Bld: 108 mg/dL — ABNORMAL HIGH (ref 70–99)
HCT: 45 % (ref 39.0–52.0)
Hemoglobin: 15.3 g/dL (ref 13.0–17.0)
Potassium: 3.9 mEq/L (ref 3.5–5.1)
Sodium: 143 mEq/L (ref 135–145)
TCO2: 22 mmol/L (ref 0–100)

## 2012-09-04 LAB — POCT I-STAT TROPONIN I: Troponin i, poc: 0 ng/mL (ref 0.00–0.08)

## 2012-09-04 MED ORDER — ZOLPIDEM TARTRATE 5 MG PO TABS: 5.0000 mg | ORAL_TABLET | Freq: Every evening | ORAL | Status: DC | PRN

## 2012-09-04 MED ORDER — CIPROFLOXACIN HCL 500 MG PO TABS
500.0000 mg | ORAL_TABLET | Freq: Once | ORAL | Status: AC
  Administered 2012-09-04: 500 mg via ORAL
  Filled 2012-09-04: qty 1

## 2012-09-04 MED ORDER — ONDANSETRON HCL 4 MG/2ML IJ SOLN: 4.0000 mg | Freq: Four times a day (QID) | INTRAMUSCULAR | Status: DC | PRN

## 2012-09-04 MED ORDER — CLINDAMYCIN HCL 300 MG PO CAPS
300.0000 mg | ORAL_CAPSULE | Freq: Once | ORAL | Status: AC
  Administered 2012-09-04: 300 mg via ORAL
  Filled 2012-09-04: qty 1

## 2012-09-04 MED ORDER — AMLODIPINE BESYLATE 5 MG PO TABS
5.0000 mg | ORAL_TABLET | Freq: Every day | ORAL | Status: DC
  Administered 2012-09-05: 5 mg via ORAL
  Filled 2012-09-04 (×2): qty 1

## 2012-09-04 MED ORDER — ENOXAPARIN SODIUM 40 MG/0.4ML ~~LOC~~ SOLN
40.0000 mg | SUBCUTANEOUS | Status: DC
  Filled 2012-09-04 (×2): qty 0.4

## 2012-09-04 MED ORDER — LORATADINE 10 MG PO TABS
10.0000 mg | ORAL_TABLET | Freq: Every day | ORAL | Status: DC
  Administered 2012-09-04 – 2012-09-05 (×2): 10 mg via ORAL
  Filled 2012-09-04 (×2): qty 1

## 2012-09-04 MED ORDER — MELATONIN 3 MG PO TABS
3.0000 | ORAL_TABLET | Freq: Every evening | ORAL | Status: DC | PRN
Start: 2012-09-04 — End: 2012-09-04

## 2012-09-04 MED ORDER — PANTOPRAZOLE SODIUM 20 MG PO TBEC
20.0000 mg | DELAYED_RELEASE_TABLET | Freq: Every day | ORAL | Status: DC
  Administered 2012-09-04 – 2012-09-05 (×2): 20 mg via ORAL
  Filled 2012-09-04 (×2): qty 1

## 2012-09-04 MED ORDER — ASPIRIN EC 325 MG PO TBEC
325.0000 mg | DELAYED_RELEASE_TABLET | Freq: Every day | ORAL | Status: DC
  Administered 2012-09-05: 325 mg via ORAL
  Filled 2012-09-04: qty 1

## 2012-09-04 MED ORDER — FLUTICASONE PROPIONATE 50 MCG/ACT NA SUSP: 2.0000 | NASAL | Status: DC | PRN

## 2012-09-04 MED ORDER — ASPIRIN EC 81 MG PO TBEC: 81.0000 mg | DELAYED_RELEASE_TABLET | Freq: Every day | ORAL | Status: DC

## 2012-09-04 MED ORDER — ACETAMINOPHEN 325 MG PO TABS
650.0000 mg | ORAL_TABLET | ORAL | Status: DC | PRN
  Administered 2012-09-04: 650 mg via ORAL
  Filled 2012-09-04: qty 2

## 2012-09-04 MED ORDER — ADULT MULTIVITAMIN W/MINERALS CH
1.0000 | ORAL_TABLET | Freq: Every day | ORAL | Status: DC
  Administered 2012-09-04 – 2012-09-05 (×2): 1 via ORAL
  Filled 2012-09-04 (×2): qty 1

## 2012-09-04 MED ORDER — METOPROLOL TARTRATE 12.5 MG HALF TABLET
12.5000 mg | ORAL_TABLET | Freq: Two times a day (BID) | ORAL | Status: DC
  Administered 2012-09-04 – 2012-09-05 (×2): 12.5 mg via ORAL
  Filled 2012-09-04 (×3): qty 1

## 2012-09-04 NOTE — H&P (Addendum)
[facility] History and Physical  Donald Clark KGM:[id number] DOB: 02-May-1960 DOA: 09/04/2012  Referring physician:  PCP: Kristian Covey, MD  Specialists:  Chief Complaint: CP after dog bite.  HPI: Donald Clark is a 52 y.o.  WM, PMHx HTN, Hx smoking 30 yrs ago,. no family history of premature coronary disease.  States was breaking up a fight between 2 of his dogs and another dog, he has a couple superficial abrasions on his right hand from his own dogs mouth but does not have any full thickness skin puncture or bite, he is no weakness or numbness in the hand, however for somewhere between 30 minutes to an hour during and after the episode of breaking up a fight between the dogs the patient developed substernal chest pressure tightness shortness of breath diaphoresis nausea without radiation it gradually came on gradually resolved and is now resolved completely he received aspirin at home prior to arrival and was transported by EMS to the emergency department, his symptoms occurred and resolved just prior to arrival. Patient's tetanus shot is up-to-date the patient has the abrasions on his right hand from his own dog and the dog's shots are up-to-date. TODAY; first set of cardiac enzymes negative patient states has had chest pain episodes in the past however sought no treatment but did take aspirin, attributed it to his severe GERD   Review of Systems: The patient denies anorexia, fever, weight loss,, vision loss, decreased hearing, hoarseness, syncope,  peripheral edema, balance deficits, hemoptysis, abdominal pain, melena, hematochezia, hematuria, incontinence, genital sores, muscle weakness, suspicious skin lesions, transient blindness, difficulty walking, depression, unusual weight change, abnormal bleeding, enlarged lymph nodes, angioedema, and breast masses.    Past Medical History  Diagnosis Date  . Chicken pox   . Depression   . GERD (gastroesophageal reflux disease)   . Allergy    . Hypertension   . Chronic kidney disease     stones  . Prostatitis   . Cancer     skin- multiple removed-1 basal cell, 1 squamous cell   Past Surgical History  Procedure Laterality Date  . Upper gastrointestinal endoscopy    . Colonoscopy    . Lithotripsy    . Skin cancer removed      on ankle, back neck   Social History:  reports that he quit smoking about 31 years ago. His smoking use included Cigarettes. He has a 2 pack-year smoking history. He has never used smokeless tobacco. He reports that he does not drink alcohol or use illicit drugs.   where does patient live--home, home with wife Can patient participate in ADLs? yes  Allergies  Allergen Reactions  . Penicillins     GI upset  . Sulfa Antibiotics     Hives, joint pain    Family History  Problem Relation Age of Onset  . Cancer Mother     colon, uterine CA  . Hypertension Mother   . Colon cancer Mother   . Diabetes Maternal Grandfather   . Diabetes Paternal Grandmother   . Cancer Father 62    colon cancer  . Rectal cancer Father   . Esophageal cancer Neg Hx   . Stomach cancer Neg Hx      Prior to Admission medications   Medication Sig Start Date End Date Taking? Authorizing Provider  amLODipine (NORVASC) 10 MG tablet Take 5 mg by mouth daily.    Yes Historical Provider, MD  aspirin EC 81 MG tablet Take 81 mg by mouth daily.  Yes Historical Provider, MD  fluticasone (FLONASE) 50 MCG/ACT nasal spray Place 2 sprays into the nose as needed for allergies.  11/13/10 01/30/15 Yes Kristian Covey, MD  lansoprazole (PREVACID) 15 MG capsule Take 15 mg by mouth daily as needed (Acid reflux).   Yes Historical Provider, MD  loratadine (CLARITIN) 10 MG tablet Take 10 mg by mouth daily.   Yes Historical Provider, MD  Melatonin 3 MG TABS Take 3 tablets by mouth at bedtime as needed (Sleep).   Yes Historical Provider, MD  Multiple Vitamin (MULTIVITAMIN WITH MINERALS) TABS Take 1 tablet by mouth daily.   Yes Historical  Provider, MD   Physical Exam: Filed Vitals:   09/04/12 1400 09/04/12 1439 09/04/12 1500 09/04/12 1600  BP: 101/76 130/80 134/82 132/83  Pulse: 89  89 96  Temp:      TempSrc:      Resp: 10 16 11 13   SpO2: 98% 95% 100% 99%     General:  Alert, resting comfortably in bed  Cardiovascular: Regular rhythm and rate, no murmurs rubs or gallops, DP/PT pulses 2+ bilaterally  Respiratory: Clear to auscultation bilaterally  Abdomen: Soft, nontender, nondistended, plus bowel sounds  Skin: Clean/Covered laceration on right hand in the webbing between the thumb and second metacarpal   Musculoskeletal: Negative pedal edema  Labs on Admission:  Basic Metabolic Panel:  Recent Labs Lab 09/04/12 1423  NA 143  K 3.9  CL 106  GLUCOSE 108*  BUN 12  CREATININE 1.20   Liver Function Tests: No results found for this basename: AST, ALT, ALKPHOS, BILITOT, PROT, ALBUMIN,  in the last 168 hours No results found for this basename: LIPASE, AMYLASE,  in the last 168 hours No results found for this basename: AMMONIA,  in the last 168 hours CBC:  Recent Labs Lab 09/04/12 1422 09/04/12 1423  WBC 14.1*  --   NEUTROABS 12.6*  --   HGB 15.2 15.3  HCT 42.3 45.0  MCV 81.2  --   PLT 172  --    Cardiac Enzymes: No results found for this basename: CKTOTAL, CKMB, CKMBINDEX, TROPONINI,  in the last 168 hours  BNP (last 3 results) No results found for this basename: PROBNP,  in the last 8760 hours CBG: No results found for this basename: GLUCAP,  in the last 168 hours  Radiological Exams on Admission: Dg Chest Port 1 View  09/04/2012   *RADIOLOGY REPORT*  Clinical Data: Chest pain  PORTABLE CHEST - 1 VIEW  Comparison: None.  Findings: The heart size and mediastinal contours are within normal limits.  Both lungs are clear.  The visualized skeletal structures are unremarkable.  IMPRESSION: Negative exam.   Original Report Authenticated By: Signa Kell, M.D.    EKG: Normal sinus rhythm, normal  axis, unremarkable EKG  Assessment/Plan Principal Problem:   Chest pain Active Problems:   Hypertension   Dog bite of hand   1. Chest pain; will obtain serial cardiac enzymes, will obtain lipid panel, will obtain thyroid panel, will obtain 2-D cardiac echo if all are normal patient will be discharged in the a.m. 2. HTN; by AHA guidelines patient is pre-hypertensive, therefore started patient on low-dose beta blocker, as well as his home medication to 3. GERD; start on Protonix   Code Status: Full Family Communication: Discuss care plan with patient and wife Disposition Plan: Discharge in a.m. after 2-D cardiac echo  Time spent: 45 minutes  Drema Dallas [facility] Pager [phone]  If 7PM-7AM, please contact night-coverage  [email or web address] Password [email or web address] 09/04/2012, 5:59 PM

## 2012-09-04 NOTE — ED Provider Notes (Signed)
History    CSN: [id number] Arrival date & time 09/04/12  1323  First MD Initiated Contact with Patient 09/04/12 1332     Chief Complaint  Patient presents with  . Chest Pain   (Consider location/radiation/quality/duration/timing/severity/associated sxs/prior Treatment) HPI This 52 year old male does not have documented coronary artery disease but does have a history of hypertension, he is a nonsmoker, and no family history of premature coronary disease, he was breaking up a fight between 2 of his dogs and another dog, he has a couple superficial abrasions on his right hand from his own dogs mouth but does not have any full thickness skin puncture or bite, he is no weakness or numbness in the hand, however for somewhere between 30 minutes to an hour during and after the episode of breaking up a fight between the dogs the patient developed substernal chest pressure tightness shortness of breath diaphoresis nausea without radiation it gradually came on gradually resolved and is now resolved completely he received aspirin at home prior to arrival and was transported by EMS to the emergency department, his symptoms occurred and resolved just prior to arrival. Patient's tetanus shot is up-to-date the patient has the abrasions on his right hand from his own dog and the dog's shots are up-to-date. Past Medical History  Diagnosis Date  . Chicken pox   . Depression   . GERD (gastroesophageal reflux disease)   . Allergy   . Hypertension   . Chronic kidney disease     stones  . Prostatitis   . Cancer     skin- multiple removed-1 basal cell, 1 squamous cell   Past Surgical History  Procedure Laterality Date  . Upper gastrointestinal endoscopy    . Colonoscopy    . Lithotripsy    . Skin cancer removed      on ankle, back neck   Family History  Problem Relation Age of Onset  . Cancer Mother     colon, uterine CA  . Hypertension Mother   . Colon cancer Mother   . Diabetes Maternal  Grandfather   . Diabetes Paternal Grandmother   . Cancer Father 74    colon cancer  . Rectal cancer Father   . Esophageal cancer Neg Hx   . Stomach cancer Neg Hx    History  Substance Use Topics  . Smoking status: Former Smoker -- 0.50 packs/day for 4 years    Types: Cigarettes    Quit date: 10/13/1980  . Smokeless tobacco: Never Used  . Alcohol Use: No    Review of Systems 10 Systems reviewed and are negative for acute change except as noted in the HPI. Allergies  Penicillins and Sulfa antibiotics  Home Medications   No current outpatient prescriptions on file. BP 144/80  Pulse 88  Temp(Src) 98.3 F (36.8 C) (Oral)  Resp 18  Ht 5\' 7"  (1.702 m)  Wt 178 lb 1.6 oz (80.786 kg)  BMI 27.89 kg/m2  SpO2 96% Physical Exam  Nursing note and vitals reviewed. Constitutional:  Awake, alert, nontoxic appearance.  HENT:  Head: Atraumatic.  Eyes: Right eye exhibits no discharge. Left eye exhibits no discharge.  Neck: Neck supple.  Cardiovascular: Normal rate and regular rhythm.   No murmur heard. Pulmonary/Chest: Effort normal and breath sounds normal. No respiratory distress. He has no wheezes. He has no rales. He exhibits no tenderness.  Abdominal: Soft. Bowel sounds are normal. He exhibits no distension and no mass. There is no tenderness. There is no rebound and  no guarding.  Musculoskeletal: He exhibits no edema and no tenderness.  Baseline ROM, no obvious new focal weakness. Right hand superficial abrasions normal light touch capillary refill less than 2 seconds no full-thickness lacerations noted no apparent weakness noted according to the patient no tenderness.  Neurological:  Mental status and motor strength appears baseline for patient and situation.  Skin: No rash noted.  Psychiatric: He has a normal mood and affect.    ED Course  Procedures (including critical care time) ECG: Sinus rhythm, ventricular rate 77, normal axis, normal intervals, no acute ischemic  changes noted, no significant change noted compared with the December 2007, impression normal ECG  HEART score 4 intermediate ACS risk  Patient / Family / Caregiver understand and agree with initial ED impression and plan with expectations set for ED visit.Pt stable in ED with no significant deterioration in condition.Patient / Family / Caregiver informed of clinical course, understand medical decision-making process, and agree with plan.d/w [facility] for Obs.  Labs Reviewed  CBC WITH DIFFERENTIAL - Abnormal; Notable for the following:    WBC 14.1 (*)    Neutrophils Relative % 90 (*)    Neutro Abs 12.6 (*)    Lymphocytes Relative 5 (*)    All other components within normal limits  CBC - Abnormal; Notable for the following:    WBC 12.0 (*)    All other components within normal limits  CREATININE, SERUM - Abnormal; Notable for the following:    GFR calc non Af Amer 62 (*)    GFR calc Af Amer 71 (*)    All other components within normal limits  POCT I-STAT, CHEM 8 - Abnormal; Notable for the following:    Glucose, Bld 108 (*)    All other components within normal limits  TROPONIN I  TROPONIN I  TROPONIN I  LIPID PANEL  POCT I-STAT TROPONIN I   Dg Chest Port 1 View  09/04/2012   *RADIOLOGY REPORT*  Clinical Data: Chest pain  PORTABLE CHEST - 1 VIEW  Comparison: None.  Findings: The heart size and mediastinal contours are within normal limits.  Both lungs are clear.  The visualized skeletal structures are unremarkable.  IMPRESSION: Negative exam.   Original Report Authenticated By: Signa Kell, M.D.   1. Chest pain   2. Dog bite of hand, right, initial encounter   3. GERD (gastroesophageal reflux disease)   4. Hypertension     MDM  The patient appears reasonably stabilized for admission considering the current resources, flow, and capabilities available in the ED at this time, and I doubt any other [facility] requiring further screening and/or treatment in the ED prior to  admission.  Hurman Horn, MD 09/04/12 2152

## 2012-09-04 NOTE — Progress Notes (Signed)
PHARMACIST - PHYSICIAN ORDER COMMUNICATION  CONCERNING: P&T Medication Policy on Herbal Medications  DESCRIPTION:  This patient's order for:  Melatonin has been noted.  This product(s) is classified as an "herbal" or natural product. Due to a lack of definitive safety studies or FDA approval, nonstandard manufacturing practices, plus the potential risk of unknown drug-drug interactions while on inpatient medications, the Pharmacy and Therapeutics Committee does not permit the use of "herbal" or natural products of this type within [place].   ACTION TAKEN: The pharmacy department is unable to verify this order at this time and your patient has been informed of this safety policy. Please reevaluate patient's clinical condition at discharge and address if the herbal or natural product(s) should be resumed at that time.  Krislynn Gronau, PharmD, BCPS  

## 2012-09-04 NOTE — ED Notes (Signed)
Pt arrived by [facility]. Was breaking up dog fight and had onset of chest pain. On ems arrival pt was hypotensive, bp 78/50, pale, diaphoretic and n/v. Received ASA 324mg  and zofran 4mg  pta.

## 2012-09-05 LAB — LIPID PANEL
Cholesterol: 155 mg/dL (ref 0–200)
HDL: 54 mg/dL (ref >39)
LDL Cholesterol: 73 mg/dL (ref 0–99)
Total CHOL/HDL Ratio: 2.9 RATIO
Triglycerides: 138 mg/dL (ref <150)
VLDL: 28 mg/dL (ref 0–40)

## 2012-09-05 LAB — TROPONIN I
Troponin I: <0.3 ng/mL (ref <0.30)
Troponin I: <0.3 ng/mL (ref <0.30)

## 2012-09-05 MED ORDER — METOPROLOL TARTRATE 12.5 MG HALF TABLET: 12.5000 mg | ORAL_TABLET | Freq: Two times a day (BID) | ORAL | Status: DC

## 2012-09-05 MED ORDER — CLINDAMYCIN HCL 300 MG PO CAPS: 300.0000 mg | ORAL_CAPSULE | Freq: Three times a day (TID) | ORAL | Status: DC

## 2012-09-05 MED ORDER — CIPROFLOXACIN HCL 500 MG PO TABS: 500.0000 mg | ORAL_TABLET | Freq: Two times a day (BID) | ORAL | Status: DC

## 2012-09-05 NOTE — Progress Notes (Signed)
  Echocardiogram 2D Echocardiogram has been performed.  Donald Clark Donald Clark 09/05/2012, 10:15 AM

## 2012-09-05 NOTE — Progress Notes (Signed)
Utilization review completed.  

## 2012-09-05 NOTE — Discharge Summary (Signed)
Physician Discharge Summary  Donald Clark RUE:[id number] DOB: 12/28/1960 DOA: 09/04/2012  PCP: Kristian Covey, MD  Admit date: 09/04/2012 Discharge date: 09/05/2012  Time spent: 30 minutes  Recommendations for Outpatient Follow-up:  1. Chest pain; obtained serial cardiac enzymes which are negative, lipid panel w/i NCEP guidelines,2-D cardiac echo complete and normaldischarge patient  2. HTN; by AHA guidelines patient is pre-hypertensive, therefore started patient on low-dose beta blocker, as well as his home medication.  3. GERD; start on Protonix, and counseled to take medication at home as directed       4.Dog bite; patient will require 4 more days of antibiotics ciprofloxacin and          clindamycin    Discharge Diagnoses:  Principal Problem:   Chest pain Active Problems:   Hypertension   Dog bite of hand   Discharge Condition: State\  Diet recommendation: Normal  Filed Weights   09/04/12 1730  Weight: 80.786 kg (178 lb 1.6 oz)    History of present illness:  Donald Clark is a 52 y.o. WM, PMHx HTN, Hx smoking 30 yrs ago,. no family history of premature coronary disease. States was breaking up a fight between 2 of his dogs and another dog, he has a couple superficial abrasions on his right hand from his own dogs mouth but does not have any full thickness skin puncture or bite, he is no weakness or numbness in the hand, however for somewhere between 30 minutes to an hour during and after the episode of breaking up a fight between the dogs the patient developed substernal chest pressure tightness shortness of breath diaphoresis nausea without radiation it gradually came on gradually resolved and is now resolved completely he received aspirin at home prior to arrival and was transported by EMS to the emergency department, his symptoms occurred and resolved just prior to arrival. Patient's tetanus shot is up-to-date the patient has the abrasions on his right hand from his own dog and  the dog's shots are up-to-date. TODAY; cardiac enzymes are negative, 2-D cardiac echo within normal limits   Procedures: 2D cardiac Echo;  Left ventricle: The cavity size was normal. There was mild focal basal hypertrophy of the septum. Systolic function was normal. The estimated ejection fraction was in the range of 55% to 60%. Wall motion was normal; there were no regional wall motion abnormalities. - Aortic valve: Trivial regurgitation. - Pulmonary arteries: PA peak pressure: 33mm Hg (S).  ; Discharge Exam: Filed Vitals:   09/04/12 1730 09/04/12 2044 09/05/12 0606 09/05/12 1419  BP: 118/79 144/80 133/87 118/72  Pulse: 88 88 81 83  Temp: 98.7 F (37.1 C) 98.3 F (36.8 C) 98.2 F (36.8 C) 98 F (36.7 C)  TempSrc:  Oral Oral   Resp: 16 18 18 18   Height: 5\' 7"  (1.702 m)     Weight: 80.786 kg (178 lb 1.6 oz)     SpO2: 95% 96% 97% 97%   General: Alert, resting comfortably in bed  Cardiovascular: Regular rhythm and rate, no murmurs rubs or gallops, DP/PT pulses 2+ bilaterally  Respiratory: Clear to auscultation bilaterally  Abdomen: Soft, nontender, nondistended, plus bowel sounds  Skin: Clean/Covered laceration on right hand in the webbing between the thumb and second metacarpal  Musculoskeletal: Negative pedal edema  Discharge Instructions     Medication List    ASK your doctor about these medications       amLODipine 10 MG tablet  Commonly known as:  NORVASC  Take 5 mg by mouth  daily.     aspirin EC 81 MG tablet  Take 81 mg by mouth daily.     fluticasone 50 MCG/ACT nasal spray  Commonly known as:  FLONASE  Place 2 sprays into the nose as needed for allergies.     lansoprazole 15 MG capsule  Commonly known as:  PREVACID  Take 15 mg by mouth daily as needed (Acid reflux).     loratadine 10 MG tablet  Commonly known as:  CLARITIN  Take 10 mg by mouth daily.     Melatonin 3 MG Tabs  Take 3 tablets by mouth at bedtime as needed (Sleep).     multivitamin  with minerals Tabs  Take 1 tablet by mouth daily.       Allergies  Allergen Reactions  . Penicillins     GI upset  . Sulfa Antibiotics     Hives, joint pain      The results of significant diagnostics from this hospitalization (including imaging, microbiology, ancillary and laboratory) are listed below for reference.    Significant Diagnostic Studies: Dg Chest Port 1 View  09/04/2012   *RADIOLOGY REPORT*  Clinical Data: Chest pain  PORTABLE CHEST - 1 VIEW  Comparison: None.  Findings: The heart size and mediastinal contours are within normal limits.  Both lungs are clear.  The visualized skeletal structures are unremarkable.  IMPRESSION: Negative exam.   Original Report Authenticated By: Signa Kell, M.D.    Microbiology: No results found for this or any previous visit (from the past 240 hour(s)).   Labs: Basic Metabolic Panel:  Recent Labs Lab 09/04/12 1423 09/04/12 1941  NA 143  --   K 3.9  --   CL 106  --   GLUCOSE 108*  --   BUN 12  --   CREATININE 1.20 1.30   Liver Function Tests: No results found for this basename: AST, ALT, ALKPHOS, BILITOT, PROT, ALBUMIN,  in the last 168 hours No results found for this basename: LIPASE, AMYLASE,  in the last 168 hours No results found for this basename: AMMONIA,  in the last 168 hours CBC:  Recent Labs Lab 09/04/12 1422 09/04/12 1423 09/04/12 1941  WBC 14.1*  --  12.0*  NEUTROABS 12.6*  --   --   HGB 15.2 15.3 15.4  HCT 42.3 45.0 43.4  MCV 81.2  --  82.2  PLT 172  --  165   Cardiac Enzymes:  Recent Labs Lab 09/04/12 1941 09/05/12 0220 09/05/12 0830  TROPONINI <0.30 <0.30 <0.30   BNP: BNP (last 3 results) No results found for this basename: PROBNP,  in the last 8760 hours CBG: No results found for this basename: GLUCAP,  in the last 168 hours     Signed:  Carolyne Littles, J  [facility] 09/05/2012, 4:16 PM

## 2012-09-06 MED ORDER — METOPROLOL TARTRATE 12.5 MG HALF TABLET: ORAL_TABLET | ORAL | Status: DC

## 2012-09-07 ENCOUNTER — Ambulatory Visit (INDEPENDENT_AMBULATORY_CARE_PROVIDER_SITE_OTHER): Payer: BC Managed Care – PPO | Admitting: Family Medicine

## 2012-09-07 ENCOUNTER — Telehealth: Payer: Self-pay | Admitting: Family Medicine

## 2012-09-07 ENCOUNTER — Encounter: Payer: Self-pay | Admitting: Family Medicine

## 2012-09-07 VITALS — BP 126/82 | HR 86 | Temp 98.4°F | Ht 67.0 in | Wt 177.0 lb

## 2012-09-07 DIAGNOSIS — R079 Chest pain, unspecified: Secondary | ICD-10-CM

## 2012-09-07 DIAGNOSIS — S61451D Open bite of right hand, subsequent encounter: Secondary | ICD-10-CM

## 2012-09-07 DIAGNOSIS — Z5189 Encounter for other specified aftercare: Secondary | ICD-10-CM

## 2012-09-07 NOTE — Telephone Encounter (Signed)
Patient Information:  Caller Name: Trey Paula  Phone: [phone]  Patient: Donald Clark, Donald Clark  Gender: Male  DOB: 1960/06/20  Age: 52 Years  PCP: Evelena Peat (Family Practice)  Office Follow Up:  Does the office need to follow up with this patient?: Yes  Instructions For The Office: do you have rabies vaccine there in the office or does patient need to go back to the ED?  RN Note:  Will talk to Dr. Caryl Never and call patient back.  Symptoms  Reason For Call & Symptoms: Patient calling, his family dog put a puncture on his right hand Sunday, 7/6.  He was seen in the ED and placed on antibiotics for same.  He thought that her rabies vaccine was up to date but has found out that they are not.   **A neighbor found a dead bat in their yard and same tested positive for rabies.  Trey Paula has decided to have his dog put down due to this and her aggressive behavior but needs to know when?  Should he keep her put up for a certain amount of days? Where does he go for rabies vaccine if needed?  Reviewed Health History In EMR: Yes  Reviewed Medications In EMR: Yes  Reviewed Allergies In EMR: Yes  Reviewed Surgeries / Procedures: Yes  Date of Onset of Symptoms: 09/04/2012  Guideline(s) Used:  Lobbyist  Disposition Per Guideline:   Go to ED Now  Reason For Disposition Reached:   Any break in skin (e.g., cut, puncture, or scratch) and dog, cat, or ferret at risk for RABIES (e.g., sick, stray, unprovoked bite, developing country)  Advice Given:  N/A  RN Overrode Recommendation:  Follow Up With Office Later  talking to MD

## 2012-09-07 NOTE — Patient Instructions (Addendum)
Follow up immediately for any chest tightness or increased shortness of breath. Observe dogs for next 10 days and follow up immediately if they are acting more aggressive or sick in any way.

## 2012-09-07 NOTE — Progress Notes (Signed)
Subjective:    Patient ID: Donald Clark, male    DOB: 1960/04/30, 52 y.o.   MRN: [id number]  HPI Emergency room/hospital followup. Patient was admitted on 09/04/2012 and discharged one day later. He was breaking up a fight between 2 of his dogs and another dog that it entered their yard. Puncture wound to his right hand and an abrasion right hand. About 30 minutes after breaking it this episode he developed some substernal chest pressure and shortness of breath without radiation. Was taken by EMS to emergency room and symptoms had resolved that time. He took aspirin prior to emergency room. Patient was admitted and enzymes negative. Echocardiogram was within normal limits. Patient states he's had no other episodes whatsoever and can generally run several miles without any difficulties.  Quit smoking many years ago. No family history of premature CAD. Patient takes amlodipine for hypertension and had addition of metoprolol 12.5 mg twice daily.  Regarding the dog bite, his dogs have been vaccinated for rabies are mostly inside dogs. However, his dog that bit him was over due for rabies vaccine booster. Neither his dogs have been acting sickly in any way nor overly aggressive. This was a provoked type of attack.  Patient was started on Cipro and clindamycin and has not had any signs of secondary infection.  Past Medical History  Diagnosis Date  . Chicken pox   . Depression   . GERD (gastroesophageal reflux disease)   . Allergy   . Hypertension   . Chronic kidney disease     stones  . Prostatitis   . Cancer     skin- multiple removed-1 basal cell, 1 squamous cell   Past Surgical History  Procedure Laterality Date  . Upper gastrointestinal endoscopy    . Colonoscopy    . Lithotripsy    . Skin cancer removed      on ankle, back neck    reports that he quit smoking about 31 years ago. His smoking use included Cigarettes. He has a 2 pack-year smoking history. He has never used smokeless  tobacco. He reports that he does not drink alcohol or use illicit drugs. family history includes Cancer in his mother; Cancer (age of onset: 70) in his father; Colon cancer in his mother; Diabetes in his maternal grandfather and paternal grandmother; Hypertension in his mother; and Rectal cancer in his father.  There is no history of Esophageal cancer and Stomach cancer. Allergies  Allergen Reactions  . Penicillins     GI upset  . Sulfa Antibiotics     Hives, joint pain       Review of Systems  Constitutional: Negative for fever and chills.  Respiratory: Negative for cough and shortness of breath.   Cardiovascular: Negative for chest pain and palpitations.  Neurological: Negative for dizziness and headaches.       Objective:   Physical Exam  Constitutional: He is oriented to person, place, and time. He appears well-developed and well-nourished.  HENT:  Mouth/Throat: Oropharynx is clear and moist.  Neck: Neck supple. No thyromegaly present.  Cardiovascular: Normal rate and regular rhythm.   Pulmonary/Chest: Effort normal and breath sounds normal. No respiratory distress. He has no wheezes. He has no rales.  Musculoskeletal: He exhibits no edema.  Neurological: He is alert and oriented to person, place, and time.  Skin:  Patient has small abrasion coughing right index finger. Small puncture wound web space between first and second digit. No signs of cellulitis or secondary infection  Assessment & Plan    #1 recent chest pain episode as above. He generally exercises very vigorously has never had any episodes prior to above or since then. We discussed further testing such as stress testing at this point he had recent normal echo and is asymptomatic with exercise we've recommended observation. #2 recent dog bite. No secondary infection. Discussed options. Very low-risk as his dogs have been previously vaccinated though possibly delinquent in getting booster. They're  basically indoor dogs and this was a provoked attack. He will have them observed for 10 days by animal control and prompt followup if any concerns. We explained only other option was going to emergency room for full vaccination but again was a very low risk scenario.

## 2012-09-07 NOTE — Telephone Encounter (Signed)
I called and spoke with pt to let him know that he would need to go to the ED for a rabies series. Pt has 9:00 appt with Dr. Caryl Never today. Will see him and ask if series is necessary, then go to ED if need be.

## 2012-09-09 ENCOUNTER — Ambulatory Visit: Payer: BC Managed Care – PPO | Admitting: Family Medicine

## 2012-09-30 ENCOUNTER — Other Ambulatory Visit: Payer: Self-pay | Admitting: Family Medicine

## 2012-10-27 ENCOUNTER — Other Ambulatory Visit: Payer: Self-pay | Admitting: Urology

## 2012-11-01 ENCOUNTER — Encounter (HOSPITAL_COMMUNITY): Payer: Self-pay

## 2012-11-03 ENCOUNTER — Ambulatory Visit (HOSPITAL_COMMUNITY): Payer: BC Managed Care – PPO

## 2012-11-03 ENCOUNTER — Encounter (HOSPITAL_COMMUNITY): Payer: Self-pay | Admitting: General Practice

## 2012-11-03 ENCOUNTER — Encounter (HOSPITAL_COMMUNITY)
Admission: RE | Disposition: A | Discharge status: Home / Self Care | Payer: Self-pay | Source: Ambulatory Visit | Attending: Urology

## 2012-11-03 ENCOUNTER — Ambulatory Visit (HOSPITAL_COMMUNITY)
Admission: RE | Admit: 2012-11-03 | Discharge: 2012-11-03 | Disposition: A | Discharge status: Home / Self Care | Payer: BC Managed Care – PPO | Source: Ambulatory Visit | Attending: Urology | Admitting: Urology

## 2012-11-03 DIAGNOSIS — I10 Essential (primary) hypertension: Secondary | ICD-10-CM | POA: Insufficient documentation

## 2012-11-03 DIAGNOSIS — N1330 Unspecified hydronephrosis: Secondary | ICD-10-CM | POA: Insufficient documentation

## 2012-11-03 DIAGNOSIS — N200 Calculus of kidney: Secondary | ICD-10-CM | POA: Insufficient documentation

## 2012-11-03 DIAGNOSIS — J45909 Unspecified asthma, uncomplicated: Secondary | ICD-10-CM | POA: Insufficient documentation

## 2012-11-03 DIAGNOSIS — K219 Gastro-esophageal reflux disease without esophagitis: Secondary | ICD-10-CM | POA: Insufficient documentation

## 2012-11-03 SURGERY — LITHOTRIPSY, ESWL
Anesthesia: LOCAL | Laterality: Right

## 2012-11-03 MED ORDER — DIAZEPAM 5 MG PO TABS
10.0000 mg | ORAL_TABLET | ORAL | Status: AC
  Administered 2012-11-03: 10 mg via ORAL
  Filled 2012-11-03: qty 2

## 2012-11-03 MED ORDER — DIPHENHYDRAMINE HCL 25 MG PO CAPS
25.0000 mg | ORAL_CAPSULE | ORAL | Status: AC
  Administered 2012-11-03: 25 mg via ORAL
  Filled 2012-11-03: qty 1

## 2012-11-03 MED ORDER — CIPROFLOXACIN HCL 500 MG PO TABS
500.0000 mg | ORAL_TABLET | ORAL | Status: AC
  Administered 2012-11-03: 500 mg via ORAL
  Filled 2012-11-03: qty 1

## 2012-11-03 MED ORDER — TAMSULOSIN HCL 0.4 MG PO CAPS: 0.4000 mg | ORAL_CAPSULE | Freq: Every day | ORAL | Status: DC

## 2012-11-03 MED ORDER — DEXTROSE-NACL 5-0.45 % IV SOLN
INTRAVENOUS | Status: DC
  Administered 2012-11-03: 07:00:00 via INTRAVENOUS

## 2012-11-03 NOTE — H&P (Signed)
History of Present Illness          Donald Clark returns for a postop visit. He was status post a lithotripsy ( fall of 2012) on the left side for about a 10 x 15 mm stone in his left renal pelvis.  The patient also had an additional 8-10 mm stone in the lower pole of that kidney and another stone in the renal pelvis on the right side.  Again, we had talked to him about his options.  We decided to go ahead and treat the larger stone in the renal pelvis on the left side initially.  He understood that there was certainly a fairly high likelihood of secondary procedure with either repeat lithotripsy or the need for ureteroscopy and stent due to passage of the stone burden.  He did require a second ESWL but then had nice clearance of the vast majority of the stone material. The stone in his right kidney he subsequently was in the lower pole we felt that it would be okay to continue to observe for a while. We strongly recommended metabolic studies and careful followup. He no showed several subsequent appointments. I did write him a letter encouraging to come in for followup.  Recently the patient developed significant right-sided flank pain and hematuria. He was seen by Diane. Stone protocol CT scan yesterday showed a rather large 1.4 cm stone in the right renal pelvis. There was moderate right hydronephrosis. There was also a smaller stone noted in the upper pole the right kidney. There were 2 tiny stones in the left kidney with the largest measuring 2 mm. Stone analysis showed mixed calcium oxalate.  Images from yesterday were reviewed. On KUB today the stone is easily visualized. It does measure 1.4 cm but does not appear to be particularly dense. There is a tiny 2 mm stone in the left kidney.    Past Medical History Problems  1. History of  Depression 311 2. History of  Esophageal Reflux 530.81 3. History of  Hypertension 401.9 4. History of  Nephrolithiasis V13.01  Surgical History Problems  1.  History of  Lithotripsy 2. History of  Lithotripsy 3. History of  Lithotripsy  Current Meds 1. Ambien 5 MG Oral Tablet; Therapy: (Recorded:16Aug2012) to 2. Claritin TABS; Therapy: (Recorded:16Aug2012) to 3. Hydrocodone-Acetaminophen 7.5-325 MG Oral Tablet; TAKE 1 TO 2 TABLETS EVERY 4 TO 6  HOURS AS NEEDED FOR PAIN; Therapy: 27Aug2014 to (Evaluate:31Aug2014); Last  Rx:27Aug2014 4. Ibuprofen TABS; Therapy: (Recorded:16Aug2012) to 5. Norvasc 10 MG Oral Tablet; Therapy: (Recorded:16Aug2012) to  Allergies Medication  1. Sulfa Drugs 2. Penicillins  Family History Problems  1. Family history of  Colon Cancer V16.0 2. Paternal history of  Death In The Family Father 50- rectal colon 3. Paternal history of  Death In The Family Mother 61- colon cancer 4. Paternal history of  Family Health Status Number Of Children 1 son & 1 daughter  Social History Problems  1. Alcohol Use 1 per day 2. Caffeine Use 2 per day 3. Former Smoker V15.82 smoked 1/2 ppd for 4 yrs & quit in 1982 4. Marital History - Currently Married 5. Occupation: Runner, broadcasting/film/video - Engineer, petroleum  Review of Systems Genitourinary, constitutional, skin, eye, otolaryngeal, hematologic/lymphatic, cardiovascular, pulmonary, endocrine, musculoskeletal, gastrointestinal, neurological and psychiatric system(s) were reviewed and pertinent findings if present are noted.  Genitourinary: hematuria, but urine stream is not weak.  Gastrointestinal: nausea, flank pain and abdominal pain.  Constitutional: feeling tired (fatigue).  Musculoskeletal: back pain and joint pain.  Vitals Vital Signs [Data Includes: Last 1 Day]  28Aug2014 08:11AM  Blood Pressure: 135 / 91 Temperature: 98.4 F Heart Rate: 74 27Aug2014 11:12AM  BMI Calculated: 27.79 BSA Calculated: 1.9 Height: 5 ft 6.5 in Weight: 175 lb  Blood Pressure: 142 / 89 Temperature: 98.4 F Heart Rate: 110  Physical Exam Constitutional: Well nourished and well developed . No  acute distress.  Pulmonary: No respiratory distress and normal respiratory rhythm and effort.  Cardiovascular: Heart rate and rhythm are normal . No peripheral edema.  Abdomen: The abdomen is soft and nontender. No masses are palpated. No CVA tenderness. No hernias are palpable. No hepatosplenomegaly noted.  Skin: Normal skin turgor, no visible rash and no visible skin lesions.  Neuro/Psych:. Mood and affect are appropriate.    Results/Data Urine [Data Includes: Last 1 Day]   27Aug2014 COLOR YELLOW  APPEARANCE CLEAR  SPECIFIC GRAVITY 1.020  pH 6.5  GLUCOSE NEG mg/dL BILIRUBIN NEG  KETONE NEG mg/dL BLOOD MOD  PROTEIN 30 mg/dL UROBILINOGEN 0.2 mg/dL NITRITE NEG  LEUKOCYTE ESTERASE NEG  SQUAMOUS EPITHELIAL/HPF RARE  WBC 0-2 WBC/hpf RBC 11-20 RBC/hpf BACTERIA RARE  CRYSTALS NONE SEEN  CASTS NONE SEEN  Other AMORPHOUS NOTED   Assessment Assessed  1. Hydronephrosis On The Right 591 2. Gross Hematuria 599.71 3. Proximal Ureteral Stone On The Right 592.1  Plan Proximal Ureteral Stone On The Right (592.1)  1. Follow-up Schedule Surgery Office  Follow-up  Done: 28Aug2014          Discussion/Summary  We discussed treatment options which in my opinion really come down to ESWL versus percutaneous nephrolithotomy. We discussed the pros and cons of each approach. He did do well with ESWL for his contralateral stone burden which was actually in total greater than wall were seen on the right side. He did require 2 treatments and he knows a secondary procedure has at least a 30% likelihood. That could include a second ESWL but also could be ureteroscopy/stent placement. He has a good track record of passing large fragments. He is interested in going after the stone with ESWL. We'll try to set something up hopefully for next week.   Signatures Electronically signed by : Barron Alvine, M.D.; Oct 27 2012  8:30AM

## 2012-11-03 NOTE — Op Note (Signed)
See [facility] OP note scanned into chart. 

## 2013-05-11 IMAGING — CR DG ABDOMEN 1V
1 series · 1 of 1 positions shown · non-contrast
Comparison: None.

CLINICAL DATA: Pre lithotripsy for left sided stone.

ABDOMEN - 1 VIEW

[t abdomen supine]
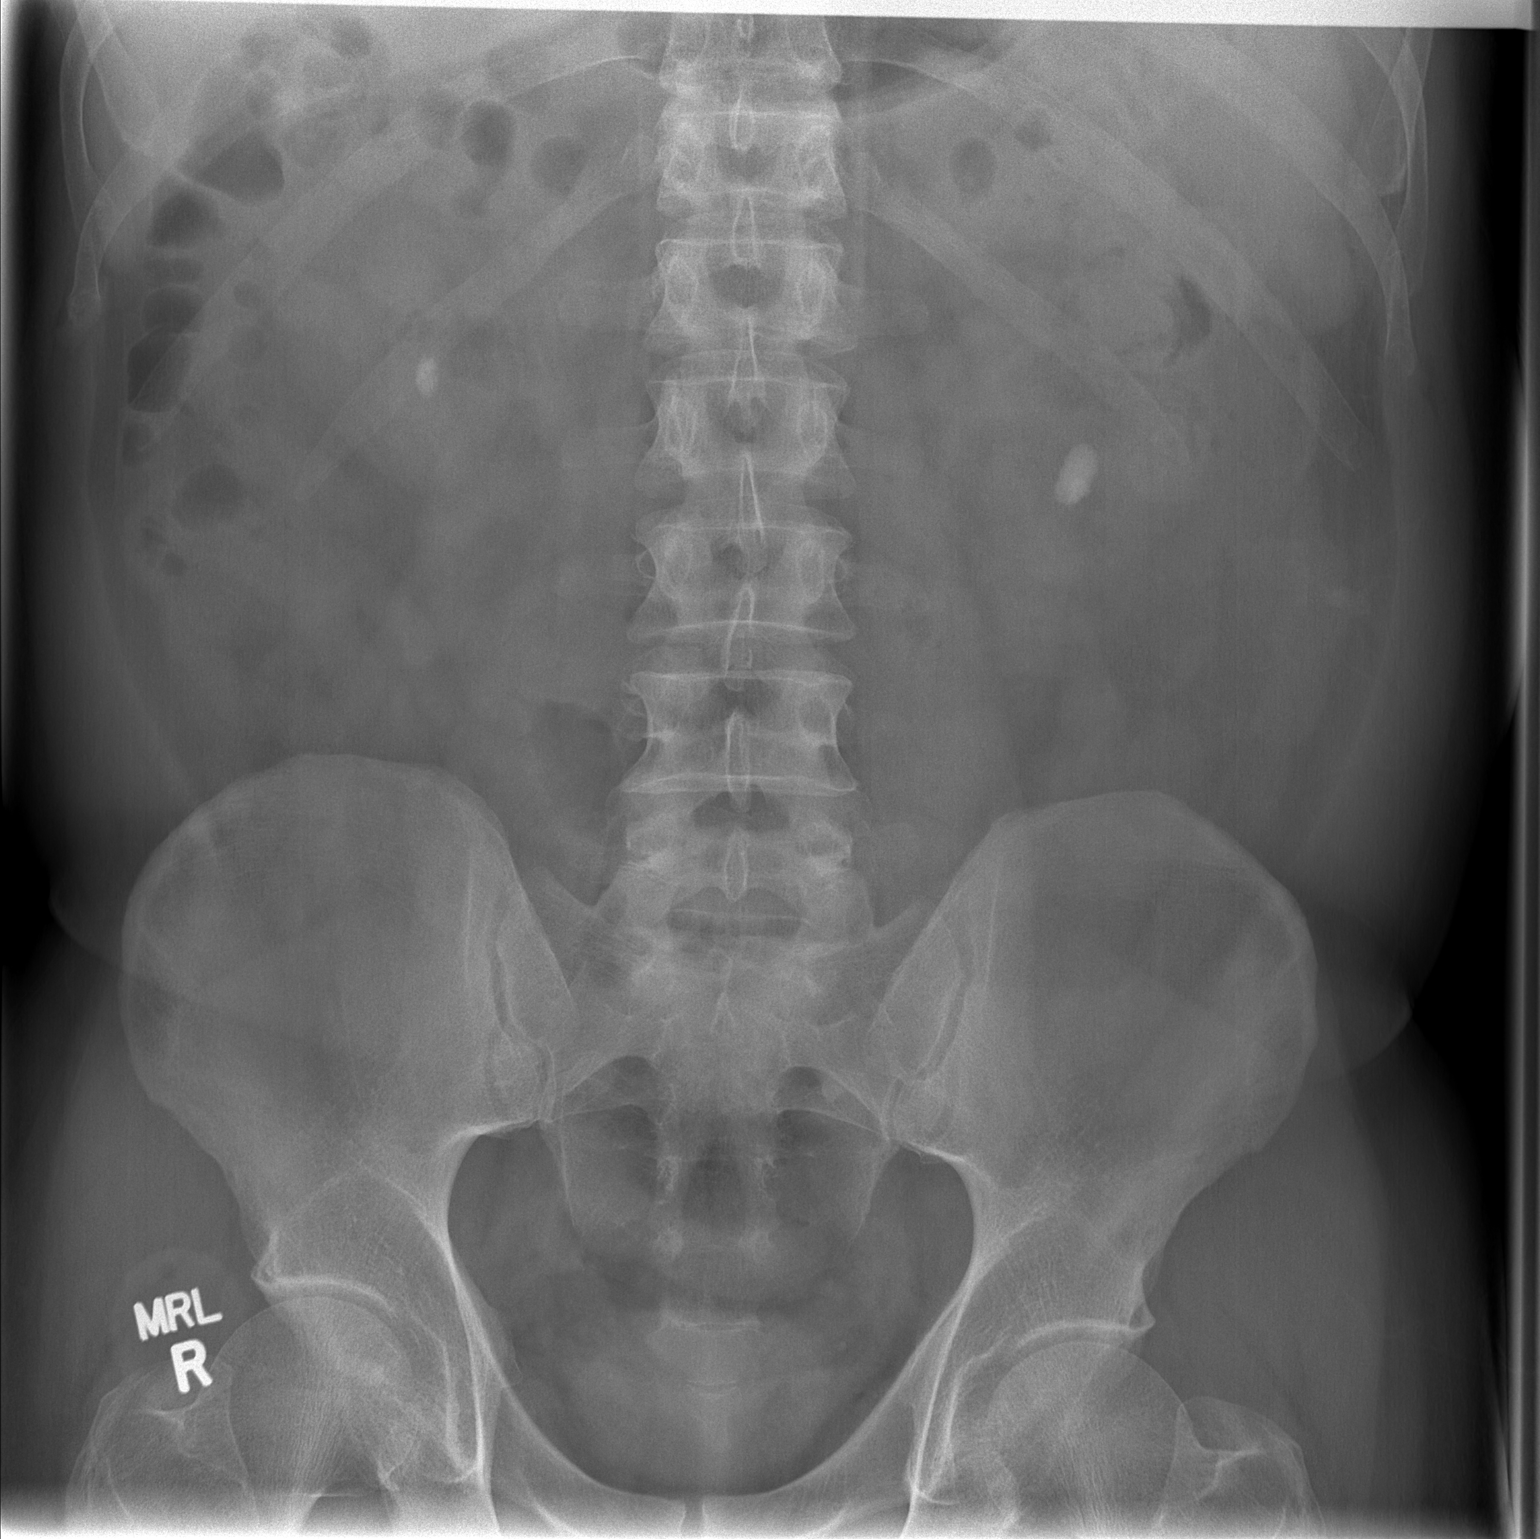

[1 of 1 positions shown; findings below may reference images not displayed]

FINDINGS: 1.9 cm calculus projects over the lower pole left kidney.
A 1.1 cm stone/calcification projects over the lower pole right
kidney.  No definite calcifications over the expected course of the
ureters.  Possible tiny calcification, likely vascular projecting
in the mid left hemi pelvis.

Nonobstructive bowel gas pattern.
IMPRESSION: Bilateral renal calculi.

## 2013-07-14 ENCOUNTER — Ambulatory Visit (INDEPENDENT_AMBULATORY_CARE_PROVIDER_SITE_OTHER): Payer: BC Managed Care – PPO | Admitting: Family Medicine

## 2013-07-14 ENCOUNTER — Encounter: Payer: Self-pay | Admitting: Family Medicine

## 2013-07-14 VITALS — BP 140/80 | HR 79 | Temp 98.5°F | Resp 20 | Ht 67.0 in | Wt 180.0 lb

## 2013-07-14 DIAGNOSIS — B029 Zoster without complications: Secondary | ICD-10-CM

## 2013-07-14 MED ORDER — VALACYCLOVIR HCL 1 G PO TABS: 1000.0000 mg | ORAL_TABLET | Freq: Three times a day (TID) | ORAL | Status: DC

## 2013-07-14 MED ORDER — OXYCODONE HCL 5 MG PO CAPS: ORAL_CAPSULE | ORAL | Status: DC

## 2013-07-14 MED ORDER — FLUTICASONE PROPIONATE 50 MCG/ACT NA SUSP: 2.0000 | NASAL | Status: DC | PRN

## 2013-07-14 NOTE — Progress Notes (Signed)
   Subjective:    Patient ID: Donald Clark, male    DOB: [birth year], 53 y.o.   MRN: [id number]  Back Pain Associated symptoms include abdominal pain. Pertinent negatives include no fever.  Abdominal Pain Pertinent negatives include no fever.  Rash Pertinent negatives include no cough, fever or shortness of breath.   Acute visit. Patient will last weekend some stabbing sharp pains mid to lower thoracic region the back. During the week some radiation anterior and inferior. Pain is relatively constant. 6-7/10 severity. Just earlier today noticed a little bit of rash in this region. No fevers or chills. Using ibuprofen 800 mg 3 times daily but still has moderate pain. No prior history of shingles.  Past Medical History  Diagnosis Date  . Chicken pox   . Depression   . GERD (gastroesophageal reflux disease)   . Allergy   . Chronic kidney disease     stones  . Prostatitis   . Cancer     skin- multiple removed-1 basal cell, 1 squamous cell  . Hypertension    Past Surgical History  Procedure Laterality Date  . Upper gastrointestinal endoscopy    . Colonoscopy    . Lithotripsy    . Skin cancer removed      on ankle, back neck    reports that he quit smoking about 32 years ago. His smoking use included Cigarettes. He has a 2 pack-year smoking history. He has never used smokeless tobacco. He reports that he does not drink alcohol or use illicit drugs. family history includes Cancer in his mother; Cancer (age of onset: 67) in his father; Colon cancer in his mother; Diabetes in his maternal grandfather and paternal grandmother; Hypertension in his mother; Rectal cancer in his father. There is no history of Esophageal cancer or Stomach cancer. Allergies  Allergen Reactions  . Penicillins     GI upset  . Sulfa Antibiotics     Hives, joint pain      Review of Systems  Constitutional: Negative for fever and chills.  Respiratory: Negative for cough and shortness of breath.     Gastrointestinal: Positive for abdominal pain.  Musculoskeletal: Positive for back pain.  Skin: Positive for rash.       Objective:   Physical Exam  Constitutional: He appears well-developed and well-nourished.  Cardiovascular: Normal rate and regular rhythm.   Pulmonary/Chest: Effort normal and breath sounds normal. No respiratory distress. He has no wheezes. He has no rales.  Skin: Rash noted.  Patient has area of rash around the mid axillary line around T10 in oval shape with a couple of superficial vesicles. No pustules. Area of rash is about 2 x 3 cm          Assessment & Plan:  Shingles involving right thoracic distribution. Valtrex 1 g 3 times a day for 7 days. Oxycodone 5 mg one to 2 every 4-6 hours for severe pain #40 with no refill. Consider addition of gabapentin by next week if not improving

## 2013-07-14 NOTE — Progress Notes (Signed)
Pre-visit discussion using our clinic review tool. No additional management support is needed unless otherwise documented below in the visit note.  

## 2013-07-14 NOTE — Patient Instructions (Signed)
Shingles Shingles (herpes zoster) is an infection that is caused by the same virus that causes chickenpox (varicella). The infection causes a painful skin rash and fluid-filled blisters, which eventually break open, crust over, and heal. It may occur in any area of the body, but it usually affects only one side of the body or face. The pain of shingles usually lasts about 1 month. However, some people with shingles may develop long-term (chronic) pain in the affected area of the body. Shingles often occurs many years after the person had chickenpox. It is more common:  In people older than 50 years.  In people with weakened immune systems, such as those with HIV, AIDS, or cancer.  In people taking medicines that weaken the immune system, such as transplant medicines.  In people under great stress. CAUSES  Shingles is caused by the varicella zoster virus (VZV), which also causes chickenpox. After a person is infected with the virus, it can remain in the person's body for years in an inactive state (dormant). To cause shingles, the virus reactivates and breaks out as an infection in a nerve root. The virus can be spread from person to person (contagious) through contact with open blisters of the shingles rash. It will only spread to people who have not had chickenpox. When these people are exposed to the virus, they may develop chickenpox. They will not develop shingles. Once the blisters scab over, the person is no longer contagious and cannot spread the virus to others. SYMPTOMS  Shingles shows up in stages. The initial symptoms may be pain, itching, and tingling in an area of the skin. This pain is usually described as burning, stabbing, or throbbing.In a few days or weeks, a painful red rash will appear in the area where the pain, itching, and tingling were felt. The rash is usually on one side of the body in a band or belt-like pattern. Then, the rash usually turns into fluid-filled blisters. They  will scab over and dry up in approximately 2 3 weeks. Flu-like symptoms may also occur with the initial symptoms, the rash, or the blisters. These may include:  Fever.  Chills.  Headache.  Upset stomach. DIAGNOSIS  Your caregiver will perform a skin exam to diagnose shingles. Skin scrapings or fluid samples may also be taken from the blisters. This sample will be examined under a microscope or sent to a lab for further testing. TREATMENT  There is no specific cure for shingles. Your caregiver will likely prescribe medicines to help you manage the pain, recover faster, and avoid long-term problems. This may include antiviral drugs, anti-inflammatory drugs, and pain medicines. HOME CARE INSTRUCTIONS   Take a cool bath or apply cool compresses to the area of the rash or blisters as directed. This may help with the pain and itching.   Only take over-the-counter or prescription medicines as directed by your caregiver.   Rest as directed by your caregiver.  Keep your rash and blisters clean with mild soap and cool water or as directed by your caregiver.  Do not pick your blisters or scratch your rash. Apply an anti-itch cream or numbing creams to the affected area as directed by your caregiver.  Keep your shingles rash covered with a loose bandage (dressing).  Avoid skin contact with:  Babies.   Pregnant women.   Children with eczema.   Elderly people with transplants.   People with chronic illnesses, such as leukemia or AIDS.   Wear loose-fitting clothing to help ease   the pain of material rubbing against the rash.  Keep all follow-up appointments with your caregiver.If the area involved is on your face, you may receive a referral for follow-up to a specialist, such as an eye doctor (ophthalmologist) or an ear, nose, and throat (ENT) doctor. Keeping all follow-up appointments will help you avoid eye complications, chronic pain, or disability.  SEEK IMMEDIATE MEDICAL  CARE IF:   You have facial pain, pain around the eye area, or loss of feeling on one side of your face.  You have ear pain or ringing in your ear.  You have loss of taste.  Your pain is not relieved with prescribed medicines.   Your redness or swelling spreads.   You have more pain and swelling.  Your condition is worsening or has changed.   You have a feveror persistent symptoms for more than 2 3 days.  You have a fever and your symptoms suddenly get worse. MAKE SURE YOU:  Understand these instructions.  Will watch your condition.  Will get help right away if you are not doing well or get worse. Document Released: 02/16/2005 Document Revised: 11/11/2011 Document Reviewed: 10/01/2011 [employer] Patient Information 2014 [employer].  

## 2013-10-10 ENCOUNTER — Other Ambulatory Visit (INDEPENDENT_AMBULATORY_CARE_PROVIDER_SITE_OTHER): Payer: BC Managed Care – PPO

## 2013-10-10 DIAGNOSIS — Z0000 Encounter for general adult medical examination without abnormal findings: Secondary | ICD-10-CM

## 2013-10-10 LAB — CBC WITH DIFFERENTIAL/PLATELET
Basophils Absolute: 0 10*3/uL (ref 0.0–0.1)
Basophils Relative: 0.5 % (ref 0.0–3.0)
Eosinophils Absolute: 0.1 10*3/uL (ref 0.0–0.7)
Eosinophils Relative: 2.9 % (ref 0.0–5.0)
HCT: 47 % (ref 39.0–52.0)
Hemoglobin: 15.8 g/dL (ref 13.0–17.0)
Lymphocytes Relative: 22.8 % (ref 12.0–46.0)
Lymphs Abs: 1 10*3/uL (ref 0.7–4.0)
MCHC: 33.6 g/dL (ref 30.0–36.0)
MCV: 86.9 fl (ref 78.0–100.0)
Monocytes Absolute: 0.4 10*3/uL (ref 0.1–1.0)
Monocytes Relative: 8.9 % (ref 3.0–12.0)
Neutro Abs: 2.9 10*3/uL (ref 1.4–7.7)
Neutrophils Relative %: 64.9 % (ref 43.0–77.0)
Platelets: 232 10*3/uL (ref 150.0–400.0)
RBC: 5.4 Mil/uL (ref 4.22–5.81)
RDW: 12.9 % (ref 11.5–15.5)
WBC: 4.5 10*3/uL (ref 4.0–10.5)

## 2013-10-10 LAB — POCT URINALYSIS DIPSTICK
Bilirubin, UA: NEGATIVE
Blood, UA: NEGATIVE
Glucose, UA: NEGATIVE
Ketones, UA: NEGATIVE
Leukocytes, UA: NEGATIVE
Nitrite, UA: NEGATIVE
Spec Grav, UA: 1.015
Urobilinogen, UA: 0.2
pH, UA: 7.5

## 2013-10-10 LAB — HEPATIC FUNCTION PANEL
ALT: 21 U/L (ref 0–53)
AST: 25 U/L (ref 0–37)
Albumin: 4.5 g/dL (ref 3.5–5.2)
Alkaline Phosphatase: 64 U/L (ref 39–117)
Bilirubin, Direct: 0.2 mg/dL (ref 0.0–0.3)
Total Bilirubin: 1.2 mg/dL (ref 0.2–1.2)
Total Protein: 7.1 g/dL (ref 6.0–8.3)

## 2013-10-10 LAB — LIPID PANEL
Cholesterol: 188 mg/dL (ref 0–200)
HDL: 48.8 mg/dL
LDL Cholesterol: 122 mg/dL — ABNORMAL HIGH (ref 0–99)
NonHDL: 139.2
Total CHOL/HDL Ratio: 4
Triglycerides: 88 mg/dL (ref 0.0–149.0)
VLDL: 17.6 mg/dL (ref 0.0–40.0)

## 2013-10-10 LAB — BASIC METABOLIC PANEL WITH GFR
BUN: 11 mg/dL (ref 6–23)
CO2: 24 meq/L (ref 19–32)
Calcium: 9.4 mg/dL (ref 8.4–10.5)
Chloride: 103 meq/L (ref 96–112)
Creatinine, Ser: 1 mg/dL (ref 0.4–1.5)
GFR: 80.97 mL/min
Glucose, Bld: 90 mg/dL (ref 70–99)
Potassium: 4.1 meq/L (ref 3.5–5.1)
Sodium: 138 meq/L (ref 135–145)

## 2013-10-10 LAB — PSA: PSA: 1 ng/mL (ref 0.10–4.00)

## 2013-10-10 LAB — TSH: TSH: 1.36 u[IU]/mL (ref 0.35–4.50)

## 2013-10-16 ENCOUNTER — Ambulatory Visit (INDEPENDENT_AMBULATORY_CARE_PROVIDER_SITE_OTHER): Payer: BC Managed Care – PPO | Admitting: Family Medicine

## 2013-10-16 ENCOUNTER — Encounter: Payer: Self-pay | Admitting: Family Medicine

## 2013-10-16 VITALS — BP 128/80 | HR 64 | Temp 98.2°F | Ht 67.0 in | Wt 183.0 lb

## 2013-10-16 DIAGNOSIS — Z0000 Encounter for general adult medical examination without abnormal findings: Secondary | ICD-10-CM

## 2013-10-16 MED ORDER — TADALAFIL 5 MG PO TABS: 5.0000 mg | ORAL_TABLET | Freq: Every day | ORAL | Status: DC

## 2013-10-16 MED ORDER — AMLODIPINE BESYLATE 10 MG PO TABS: 10.0000 mg | ORAL_TABLET | Freq: Every day | ORAL | Status: DC

## 2013-10-16 NOTE — Progress Notes (Signed)
Subjective:    Patient ID: Donald Clark, male    DOB: 06/26/1960, 53 y.o.   MRN: [id number]  HPI Patient here for complete physical. He has history of kidney stones, hypertension, GERD. Both his parents had colon cancer. He's had previous colonoscopy couple of years ago. No history of colon polyps. He takes amlodipine for hypertension blood pressure well controlled. GERD symptoms are controlled. Nonsmoker. He has recently started back more consistent exercise after gaining some weight last year. His work schedule has slowed down somewhat  History of BPH symptoms. Previously placed on Flomax per urologist but had dizziness. He is trying to get approval for Cialis 5 mg daily which did work very well for his BPH symptoms. He has nocturia and slow stream occasionally. PSA testing has been normal. He has also had some ED issues over the past year or so.  Past Medical History  Diagnosis Date  . Chicken pox   . Depression   . GERD (gastroesophageal reflux disease)   . Allergy   . Chronic kidney disease     stones  . Prostatitis   . Cancer     skin- multiple removed-1 basal cell, 1 squamous cell  . Hypertension    Past Surgical History  Procedure Laterality Date  . Upper gastrointestinal endoscopy    . Colonoscopy    . Lithotripsy    . Skin cancer removed      on ankle, back neck    reports that he quit smoking about 33 years ago. His smoking use included Cigarettes. He has a 2 pack-year smoking history. He has never used smokeless tobacco. He reports that he does not drink alcohol or use illicit drugs. family history includes Cancer in his mother; Cancer (age of onset: 84) in his father; Colon cancer in his mother; Diabetes in his maternal grandfather and paternal grandmother; Hypertension in his mother; Rectal cancer in his father. There is no history of Esophageal cancer or Stomach cancer. Allergies  Allergen Reactions  . Penicillins     GI upset  . Sulfa Antibiotics     Hives,  joint pain     Review of Systems  Constitutional: Negative for fever, activity change, appetite change and fatigue.  HENT: Negative for congestion, ear pain and trouble swallowing.   Eyes: Negative for pain and visual disturbance.  Respiratory: Negative for cough, shortness of breath and wheezing.   Cardiovascular: Negative for chest pain and palpitations.  Gastrointestinal: Negative for nausea, vomiting, abdominal pain, diarrhea, constipation, blood in stool, abdominal distention and rectal pain.  Genitourinary: Negative for dysuria, hematuria and testicular pain.  Musculoskeletal: Negative for arthralgias and joint swelling.  Skin: Negative for rash.  Neurological: Negative for dizziness, syncope and headaches.  Hematological: Negative for adenopathy.  Psychiatric/Behavioral: Negative for confusion and dysphoric mood.       Objective:   Physical Exam  Constitutional: He is oriented to person, place, and time. He appears well-developed and well-nourished. No distress.  HENT:  Head: Normocephalic and atraumatic.  Right Ear: External ear normal.  Left Ear: External ear normal.  Mouth/Throat: Oropharynx is clear and moist.  Eyes: Conjunctivae and EOM are normal. Pupils are equal, round, and reactive to light.  Neck: Normal range of motion. Neck supple. No thyromegaly present.  Cardiovascular: Normal rate, regular rhythm and normal heart sounds.   No murmur heard. Pulmonary/Chest: No respiratory distress. He has no wheezes. He has no rales.  Abdominal: Soft. Bowel sounds are normal. He exhibits no distension and  no mass. There is no tenderness. There is no rebound and no guarding.  Musculoskeletal: He exhibits no edema.  Lymphadenopathy:    He has no cervical adenopathy.  Neurological: He is alert and oriented to person, place, and time. He displays normal reflexes. No cranial nerve deficit.  Skin: No rash noted.  Psychiatric: He has a normal mood and affect.            Assessment & Plan:  Complete physical. Labs reviewed. His lipids are up somewhat and he plans to address this with exercise and weight loss.  Wrote prescription for Cialis 5 mg daily both for EGD and BPH symptoms. Previous intolerance with Flomax. We've recommended he continue every five-year colonoscopy screening with positive family history in both parents.

## 2013-10-16 NOTE — Progress Notes (Signed)
Pre visit review using our clinic review tool, if applicable. No additional management support is needed unless otherwise documented below in the visit note. 

## 2013-11-07 ENCOUNTER — Other Ambulatory Visit: Payer: Self-pay

## 2013-11-07 MED ORDER — TADALAFIL 5 MG PO TABS: 5.0000 mg | ORAL_TABLET | Freq: Every day | ORAL | Status: DC

## 2013-11-08 ENCOUNTER — Telehealth: Payer: Self-pay | Admitting: Family Medicine

## 2013-11-08 NOTE — Telephone Encounter (Signed)
PA for Cialis was denied.  Pt's plan requires pt to have been evaluated and diagnosed with BPH by a urologist.

## 2014-01-08 ENCOUNTER — Ambulatory Visit (INDEPENDENT_AMBULATORY_CARE_PROVIDER_SITE_OTHER): Payer: BC Managed Care – PPO | Admitting: Family Medicine

## 2014-01-08 ENCOUNTER — Encounter: Payer: Self-pay | Admitting: Family Medicine

## 2014-01-08 ENCOUNTER — Telehealth: Payer: Self-pay | Admitting: Family Medicine

## 2014-01-08 VITALS — BP 121/86 | HR 88 | Temp 98.9°F | Ht 67.0 in | Wt 176.0 lb

## 2014-01-08 DIAGNOSIS — N410 Acute prostatitis: Secondary | ICD-10-CM

## 2014-01-08 MED ORDER — TAMSULOSIN HCL 0.4 MG PO CAPS: 0.4000 mg | ORAL_CAPSULE | Freq: Every day | ORAL | Status: DC

## 2014-01-08 MED ORDER — CIPROFLOXACIN HCL 500 MG PO TABS: 500.0000 mg | ORAL_TABLET | Freq: Two times a day (BID) | ORAL | Status: DC

## 2014-01-08 NOTE — Telephone Encounter (Signed)
Ms. Rikki Spearing states pt needs a referral for his shoulder pain.

## 2014-01-08 NOTE — Progress Notes (Signed)
   Subjective:    Patient ID: Donald Clark, male    DOB: September 08, 1960, 53 y.o.   MRN: [id number]  HPI Here for what he thinks is a prostate infection. He has gotten these off and on over the years and he has had typical sx for the past week, including mild lower pelvic pain, slow urine stream, and mild pain in the left testicle. No fever.    Review of Systems  Constitutional: Negative for fever.  Gastrointestinal: Negative.   Genitourinary: Positive for urgency and difficulty urinating. Negative for dysuria, frequency and flank pain.       Objective:   Physical Exam  Constitutional: He appears well-developed and well-nourished.  Genitourinary:  The left testicle is mildly tender           Assessment & Plan:  Drink plenty of water

## 2014-01-08 NOTE — Progress Notes (Signed)
Pre visit review using our clinic review tool, if applicable. No additional management support is needed unless otherwise documented below in the visit note. 

## 2014-01-08 NOTE — Telephone Encounter (Signed)
Would consider referral to Dr. Gardenia Phlegm if he is interested

## 2014-01-09 ENCOUNTER — Other Ambulatory Visit: Payer: Self-pay | Admitting: Family Medicine

## 2014-01-09 DIAGNOSIS — M25519 Pain in unspecified shoulder: Secondary | ICD-10-CM

## 2014-01-09 NOTE — Telephone Encounter (Signed)
Referral is ordered

## 2014-01-29 ENCOUNTER — Other Ambulatory Visit (INDEPENDENT_AMBULATORY_CARE_PROVIDER_SITE_OTHER): Payer: BC Managed Care – PPO

## 2014-01-29 ENCOUNTER — Encounter: Payer: Self-pay | Admitting: Family Medicine

## 2014-01-29 ENCOUNTER — Ambulatory Visit (INDEPENDENT_AMBULATORY_CARE_PROVIDER_SITE_OTHER): Payer: BC Managed Care – PPO | Admitting: Family Medicine

## 2014-01-29 VITALS — BP 134/80 | HR 96 | Ht 67.0 in | Wt 178.0 lb

## 2014-01-29 DIAGNOSIS — M19019 Primary osteoarthritis, unspecified shoulder: Secondary | ICD-10-CM | POA: Insufficient documentation

## 2014-01-29 DIAGNOSIS — M129 Arthropathy, unspecified: Secondary | ICD-10-CM

## 2014-01-29 DIAGNOSIS — M25512 Pain in left shoulder: Secondary | ICD-10-CM

## 2014-01-29 DIAGNOSIS — S43432A Superior glenoid labrum lesion of left shoulder, initial encounter: Secondary | ICD-10-CM

## 2014-01-29 DIAGNOSIS — S43439A Superior glenoid labrum lesion of unspecified shoulder, initial encounter: Secondary | ICD-10-CM | POA: Insufficient documentation
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
## 2014-01-29 NOTE — Assessment & Plan Note (Signed)
I believe the patient's underlying problem is more of a labral tear that is giving him his discomfort. There is a possibility of the before meals joint arthritis that can also be contribute in. We discussed different treatment options and patient has elected for conservative therapy. Patient was given home exercises, icing protocol, prescription for topical anti-inflammatories. We discussed what activities to avoid. Patient come back again in 2-3 weeks. At that time if continuing to have pain I would consider a before meals joint injection for diagnostic as well as therapeutic purposes. Patient continues to have pain I may want to consider that also glenohumeral injection. We will discuss further at follow-up. If continuing pain I think I would also want a shoulder x-ray. Patient declined formal physical therapy.

## 2014-01-29 NOTE — Patient Instructions (Addendum)
Very nice to meet you Ice 20 minutes 2 times daily. Usually after activity and before bed. Exercises 3 times a week.  Pennsaid twice daily.  Vitamin D 200 0IU daily Turmeric 500mg  twice daily.  Lifting at 50% and only with hands within peripheral vision.  Increase weight 10% a week come back again in 3 weeks.

## 2014-02-20 ENCOUNTER — Other Ambulatory Visit (INDEPENDENT_AMBULATORY_CARE_PROVIDER_SITE_OTHER): Payer: BC Managed Care – PPO

## 2014-02-20 ENCOUNTER — Ambulatory Visit (INDEPENDENT_AMBULATORY_CARE_PROVIDER_SITE_OTHER): Payer: BC Managed Care – PPO | Admitting: Family Medicine

## 2014-02-20 ENCOUNTER — Encounter: Payer: Self-pay | Admitting: Family Medicine

## 2014-02-20 VITALS — BP 132/84 | HR 64 | Ht 67.5 in | Wt 176.0 lb

## 2014-02-20 DIAGNOSIS — M25512 Pain in left shoulder: Secondary | ICD-10-CM

## 2014-02-20 DIAGNOSIS — S43432D Superior glenoid labrum lesion of left shoulder, subsequent encounter: Secondary | ICD-10-CM

## 2014-02-20 DIAGNOSIS — M19019 Primary osteoarthritis, unspecified shoulder: Secondary | ICD-10-CM

## 2014-02-20 DIAGNOSIS — M129 Arthropathy, unspecified: Secondary | ICD-10-CM

## 2014-02-20 NOTE — Progress Notes (Signed)
[facility] [place],  [address] Phone: [phone] Subjective:     CC: Shoulder pain. Left, follow up  Donald Clark is a 53 y.o. male coming in with complaint of shoulder pain. Had questionable tear of labrum and moderate osteophytic changes of the acromial clavicular joint. Patient elected to try conservative therapy with topical anti-inflammatory's, icing protocol, home exercises. Patient states overall he is not made any significant improvement. Patient has not been doing the exercises on a regular basis and has not been icing. Patient is not on any of the over-the-counter natural supplementation's.     Past medical history, social, surgical and family history all reviewed in electronic medical record.   Review of Systems: No headache, visual changes, nausea, vomiting, diarrhea, constipation, dizziness, abdominal pain, skin rash, fevers, chills, night sweats, weight loss, swollen lymph nodes, body aches, joint swelling, muscle aches, chest pain, shortness of breath, mood changes.   Objective Blood pressure 132/84, pulse 64, height 5' 7.5" (1.715 m), weight 176 lb (79.833 kg).  General: No apparent distress alert and oriented x3 mood and affect normal, dressed appropriately.  HEENT: Pupils equal, extraocular movements intact  Respiratory: Patient's speak in full sentences and does not appear short of breath  Cardiovascular: No lower extremity edema, non tender, no erythema  Skin: Warm dry intact with no signs of infection or rash on extremities or on axial skeleton.  Abdomen: Soft nontender  Neuro: Cranial nerves II through XII are intact, neurovascularly intact in all extremities with 2+ DTRs and 2+ pulses.  Lymph: No lymphadenopathy of posterior or anterior cervical chain or axillae bilaterally.  Gait normal with good balance and coordination.  MSK:  Non tender with full range of motion and good stability and  symmetric strength and tone of shoulders, elbows, wrist, hip, knee and ankles bilaterally.  Shoulder: left Inspection reveals no abnormalities, atrophy or asymmetry. Patient does have tenderness over the before meals joint ROM is full in all planes passively. Rotator cuff strength normal throughout. signs of impingement with positive Neer and Hawkin's tests, but negative empty can sign. Speeds and Yergason's tests normal.  labral pathology noted with positive Obrien's, negative clunk and good stability. Normal scapular function observed. No painful arc and no drop arm sign. No apprehension sign  Procedure: Real-time Ultrasound Guided Injection of left glenohumeral joint Device: GE Logiq E  Ultrasound guided injection is preferred based studies that show increased duration, increased effect, greater accuracy, decreased procedural pain, increased response rate with ultrasound guided versus blind injection.  Verbal informed consent obtained.  Time-out conducted.  Noted no overlying erythema, induration, or other signs of local infection.  Skin prepped in a sterile fashion.  Local anesthesia: Topical Ethyl chloride.  With sterile technique and under real time ultrasound guidance:  Joint visualized.  23g 1  inch needle inserted posterior approach. Pictures taken for needle placement. Patient did have injection of 2 cc of 1% lidocaine, 2 cc of 0.5% Marcaine, and 1cc of Kenalog 40 mg/dL. Completed without difficulty  Pain immediately resolved suggesting accurate placement of the medication.  Advised to call if fevers/chills, erythema, induration, drainage, or persistent bleeding.  Images permanently stored and available for review in the ultrasound unit.  Impression: Technically successful ultrasound guided injection.   Procedure: Real-time Ultrasound Guided Injection of left acromial clavicular joint Device: GE Logiq E  Ultrasound guided injection is preferred based studies that show  increased duration, increased effect, greater accuracy, decreased procedural pain,  increased response rate, and decreased cost with ultrasound guided versus blind injection.  Verbal informed consent obtained.  Time-out conducted.  Noted no overlying erythema, induration, or other signs of local infection.  Skin prepped in a sterile fashion.  Local anesthesia: Topical Ethyl chloride.  With sterile technique and under real time ultrasound guidance:  With a 25-gauge 5 inch needle patient was injected with 0.5 mL of 0.5% Marcaine and 0.5 mL of Kenalog 40 mg/dL. Completed without difficulty  Pain immediately resolved suggesting accurate placement of the medication.  Advised to call if fevers/chills, erythema, induration, drainage, or persistent bleeding.  Images permanently stored and available for review in the ultrasound unit.  Impression: Technically successful ultrasound guided injection.     Impression and Recommendations:     This case required medical decision making of moderate complexity.

## 2014-02-20 NOTE — Patient Instructions (Signed)
Good to see you We tried an injection today.  Conitnue the exercises Ice is your friend If you change your mind on PT call me.  See me again in 3-4 weeks.  Happy holidays!  '

## 2014-02-20 NOTE — Assessment & Plan Note (Signed)
Patient was given an injection today. We discussed icing put on home exercises. I do think patient is going to do relatively well. We discussed the possibility of x-rays. Patient declined. Patient also declined formal physical therapy. Patient will continue with the home exercises and try to do them on a more regular basis. Patient come back in 3-4 weeks for further evaluation.

## 2014-02-20 NOTE — Assessment & Plan Note (Signed)
Ultrasound guided injection noted. Patient did have this today and did tolerate the procedure well. We discussed icing per: Home exercises. We discussed range of motion activities. We discussed the possibility of formal physical therapy which patient declined. We discussed icing pedicle. Patient will try to be more religious on his exercises and will come back in 3-4 weeks for further evaluation and treatment.  Spent greater than 25 minutes with patient face-to-face and had greater than 50% of counseling including as described above in assessment and plan.

## 2014-03-20 ENCOUNTER — Ambulatory Visit: Payer: BC Managed Care – PPO | Admitting: Family Medicine

## 2014-08-01 ENCOUNTER — Encounter: Payer: Self-pay | Admitting: Family Medicine

## 2014-08-01 ENCOUNTER — Ambulatory Visit (INDEPENDENT_AMBULATORY_CARE_PROVIDER_SITE_OTHER): Payer: BC Managed Care – PPO | Admitting: Family Medicine

## 2014-08-01 VITALS — BP 120/70 | HR 75 | Temp 98.6°F | Wt 179.0 lb

## 2014-08-01 DIAGNOSIS — G44219 Episodic tension-type headache, not intractable: Secondary | ICD-10-CM

## 2014-08-01 MED ORDER — CYCLOBENZAPRINE HCL 10 MG PO TABS: 10.0000 mg | ORAL_TABLET | Freq: Three times a day (TID) | ORAL | Status: DC | PRN

## 2014-08-01 NOTE — Progress Notes (Signed)
Pre visit review using our clinic review tool, if applicable. No additional management support is needed unless otherwise documented below in the visit note. 

## 2014-08-01 NOTE — Progress Notes (Signed)
   Subjective:    Patient ID: Donald Clark, male    DOB: [birth year], 54 y.o.   MRN: [id number]  HPI Patient seen with headache for the past couple weeks. Relatively constant. He describes a dull bilateral headache. He notices some muscle spasm and tension in his neck and upper back. He has had similar type headache in the past. He's tried occasional over-the-counter non-steroidal without improvement. Denies any fevers or chills. No focal weakness. No exacerbating factors. Temporarily improved with heat and muscle massage. No associated nausea or vomiting.  Past Medical History  Diagnosis Date  . Chicken pox   . Depression   . GERD (gastroesophageal reflux disease)   . Allergy   . Chronic kidney disease     stones  . Prostatitis   . Cancer     skin- multiple removed-1 basal cell, 1 squamous cell  . Hypertension    Past Surgical History  Procedure Laterality Date  . Upper gastrointestinal endoscopy    . Colonoscopy    . Lithotripsy    . Skin cancer removed      on ankle, back neck    reports that he quit smoking about 33 years ago. His smoking use included Cigarettes. He has a 2 pack-year smoking history. He has never used smokeless tobacco. He reports that he drinks alcohol. He reports that he does not use illicit drugs. family history includes Cancer in his mother; Cancer (age of onset: 57) in his father; Colon cancer in his mother; Diabetes in his maternal grandfather and paternal grandmother; Hypertension in his mother; Rectal cancer in his father. There is no history of Esophageal cancer or Stomach cancer. Allergies  Allergen Reactions  . Penicillins     GI upset  . Sulfa Antibiotics     Hives, joint pain      Review of Systems  Constitutional: Negative for fever, chills and appetite change.  HENT: Negative for congestion.   Respiratory: Negative for shortness of breath.   Cardiovascular: Negative for chest pain.  Neurological: Positive for headaches.         Objective:   Physical Exam  Constitutional: He is oriented to person, place, and time. He appears well-developed and well-nourished.  Eyes: Pupils are equal, round, and reactive to light.  Fundi benign  Neck: Neck supple.  Cardiovascular: Normal rate and regular rhythm.   No murmur heard. Pulmonary/Chest: Effort normal and breath sounds normal. No respiratory distress. He has no wheezes. He has no rales.  Lymphadenopathy:    He has no cervical adenopathy.  Neurological: He is alert and oriented to person, place, and time. No cranial nerve deficit.          Assessment & Plan:  Acute tension-type headache. Continue heat and muscle massage. Flexeril 10 mg daily at bedtime. Follow-up if headaches not resolving over the next couple of weeks. Avoid daily use of analgesia

## 2014-08-01 NOTE — Patient Instructions (Signed)

## 2014-11-04 ENCOUNTER — Other Ambulatory Visit: Payer: Self-pay | Admitting: Family Medicine

## 2014-11-15 ENCOUNTER — Other Ambulatory Visit: Payer: BC Managed Care – PPO

## 2014-11-26 ENCOUNTER — Encounter: Payer: BC Managed Care – PPO | Admitting: Family Medicine

## 2015-02-26 ENCOUNTER — Other Ambulatory Visit: Payer: BC Managed Care – PPO

## 2015-02-27 IMAGING — CR DG CHEST 1V PORT
1 series · 1 of 1 positions shown · non-contrast
Comparison: None.

CLINICAL DATA: Chest pain

PORTABLE CHEST - 1 VIEW

[AP]
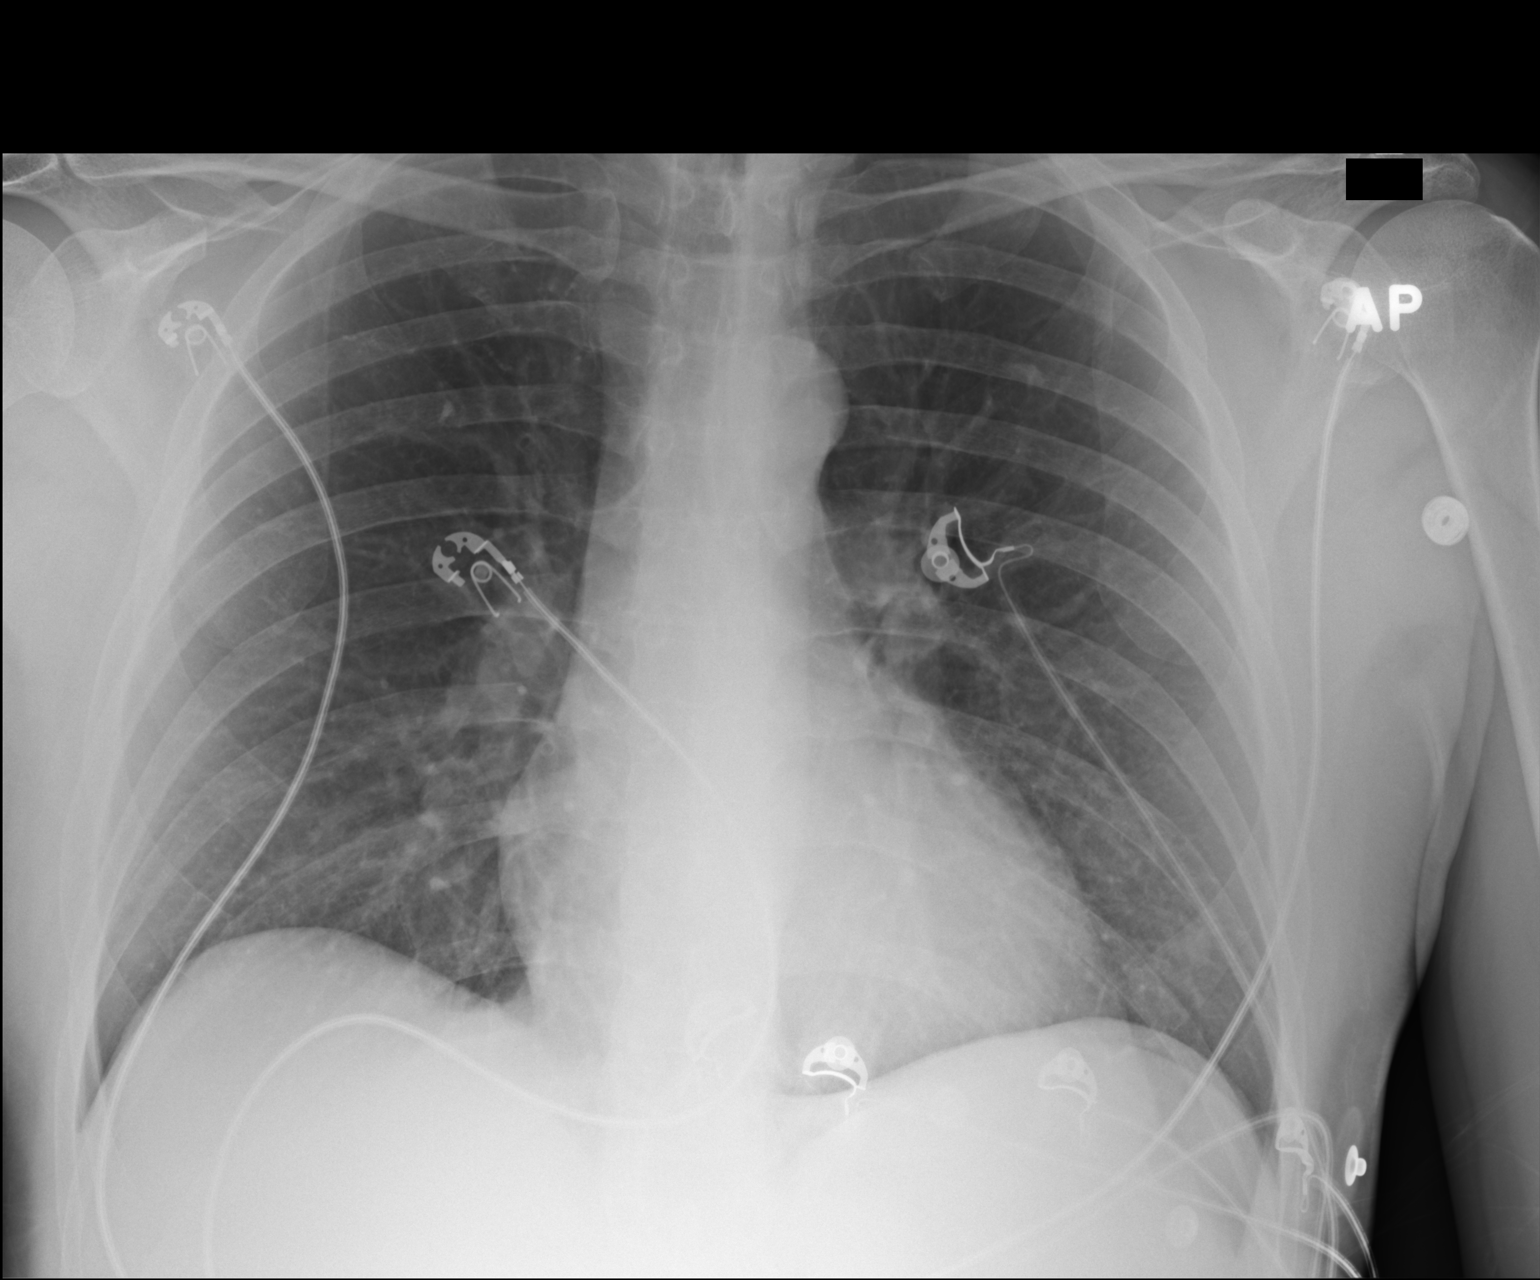

[1 of 1 positions shown; findings below may reference images not displayed]

FINDINGS: The heart size and mediastinal contours are within normal
limits.  Both lungs are clear.  The visualized skeletal structures
are unremarkable.
IMPRESSION: Negative exam.

## 2015-03-01 ENCOUNTER — Encounter: Payer: BC Managed Care – PPO | Admitting: Family Medicine

## 2015-03-03 DIAGNOSIS — B029 Zoster without complications: Secondary | ICD-10-CM

## 2015-03-03 HISTORY — DX: Zoster without complications: B02.9

## 2015-04-11 ENCOUNTER — Encounter: Payer: Self-pay | Admitting: Family Medicine

## 2015-04-11 ENCOUNTER — Other Ambulatory Visit (INDEPENDENT_AMBULATORY_CARE_PROVIDER_SITE_OTHER): Payer: BC Managed Care – PPO

## 2015-04-11 ENCOUNTER — Ambulatory Visit (INDEPENDENT_AMBULATORY_CARE_PROVIDER_SITE_OTHER): Payer: BC Managed Care – PPO | Admitting: Family Medicine

## 2015-04-11 VITALS — BP 128/82 | HR 76 | Ht 67.5 in | Wt 178.0 lb

## 2015-04-11 DIAGNOSIS — S43432D Superior glenoid labrum lesion of left shoulder, subsequent encounter: Secondary | ICD-10-CM

## 2015-04-11 DIAGNOSIS — M25512 Pain in left shoulder: Secondary | ICD-10-CM | POA: Diagnosis not present

## 2015-04-11 DIAGNOSIS — M129 Arthropathy, unspecified: Secondary | ICD-10-CM

## 2015-04-11 DIAGNOSIS — R200 Anesthesia of skin: Secondary | ICD-10-CM | POA: Diagnosis not present

## 2015-04-11 DIAGNOSIS — M19019 Primary osteoarthritis, unspecified shoulder: Secondary | ICD-10-CM

## 2015-04-11 NOTE — Assessment & Plan Note (Signed)
If any worsening symptoms we will need to look and neck for any cervical radiculopathy. We'll being that this is just secondary to muscle spasms that has been compensating for the shoulder pain.

## 2015-04-11 NOTE — Assessment & Plan Note (Signed)
Has done very well with the conservative therapy. Was have an exacerbation. Given another injection today. Patient will start doing the home exercises on a more regular basis as well as an icing protocol. Given trial of topical anti-inflammatories. If worsening symptoms we will need to consider either further imaging, formal physical therapy, or see if any cervical radiculopathy could be contribute in.

## 2015-04-11 NOTE — Assessment & Plan Note (Signed)
Given an injection today. Mild to moderate arthritic changes noted. We discussed topical anti-inflammatories and patient given a trial size. Patient likes and we will set up her prescription in. Discussed possible formal physical therapy which patient declined. Patient has home exercises and given a refracture. Patient will come back in 3-4 weeks for further evaluation.

## 2015-04-11 NOTE — Progress Notes (Signed)
Pre visit review using our clinic review tool, if applicable. No additional management support is needed unless otherwise documented below in the visit note. 

## 2015-04-11 NOTE — Progress Notes (Signed)
[name] [address] [place], [place] [address] Phone: [phone] Subjective:     CC: Shoulder pain. Left, follow up  [id number] Donald Clark is a 55 y.o. male coming in with complaint of shoulder pain. Had questionable tear of labrum and moderate osteophytic changes of the acromial clavicular joint. Seen over a year ago. Was given an injection in the glenohumeral as well as acromial clavicular joint. States that it is completely pain-free for the last 11 months. Patient states over the course last month it started worsening again. Started using ibuprofen on a more regular basis. Last several weeks has been waking him up at night. Very somewhat to previous presentation. Denies any weakness, has had some very minimal radiation or numbness he states into the fingers mostly the pinky and ring finger. This is very seldom and always goes away when he changes positions.  Past Medical History  Diagnosis Date  . Chicken pox   . Depression   . GERD (gastroesophageal reflux disease)   . Allergy   . Chronic kidney disease     stones  . Prostatitis   . Cancer ([place])     skin- multiple removed-1 basal cell, 1 squamous cell  . Hypertension    Past Surgical History  Procedure Laterality Date  . Upper gastrointestinal endoscopy    . Colonoscopy    . Lithotripsy    . Skin cancer removed      on ankle, back neck   Social History  Substance Use Topics  . Smoking status: Former Smoker -- 0.50 packs/day for 4 years    Types: Cigarettes    Quit date: 10/13/1980  . Smokeless tobacco: Never Used  . Alcohol Use: 0.0 oz/week    0 Standard drinks or equivalent per week     Comment: occ   Allergies  Allergen Reactions  . Penicillins     GI upset  . Sulfa Antibiotics     Hives, joint pain   Family History  Problem Relation Age of Onset  . Cancer Mother     colon, uterine CA  . Hypertension Mother   . Colon cancer Mother   . Diabetes Maternal  Grandfather   . Diabetes Paternal Grandmother   . Cancer Father 52    colon cancer  . Rectal cancer Father   . Esophageal cancer Neg Hx   . Stomach cancer Neg Hx    .     Past medical history, social, surgical and family history all reviewed in electronic medical record.   Review of Systems: No headache, visual changes, nausea, vomiting, diarrhea, constipation, dizziness, abdominal pain, skin rash, fevers, chills, night sweats, weight loss, swollen lymph nodes, body aches, joint swelling, muscle aches, chest pain, shortness of breath, mood changes.   Objective Blood pressure 128/82, pulse 76, height 5' 7.5" (1.715 m), weight 178 lb (80.74 kg), SpO2 97 %.  General: No apparent distress alert and oriented x3 mood and affect normal, dressed appropriately.  HEENT: Pupils equal, extraocular movements intact  Respiratory: Patient's speak in full sentences and does not appear short of breath  Cardiovascular: No lower extremity edema, non tender, no erythema  Skin: Warm dry intact with no signs of infection or rash on extremities or on axial skeleton.  Abdomen: Soft nontender  Neuro: Cranial nerves II through XII are intact, neurovascularly intact in all extremities with 2+ DTRs and 2+ pulses.  Lymph: No lymphadenopathy of posterior or anterior cervical chain or axillae bilaterally.  Gait normal with good balance and coordination.  MSK:  Non tender with full range of motion and good stability and symmetric strength and tone of shoulders, elbows, wrist, hip, knee and ankles bilaterally.  Shoulder: left Inspection reveals no abnormalities, atrophy or asymmetry. Tender over the acromioclavicular joint ROM is full in all planes passively. I'll positive impingement signs of impingement with positive Neer and Hawkin's tests, but negative empty can sign. Speeds and Yergason's tests normal.  labral pathology noted with positive Obrien's, negative clunk and good stability. Normal scapular function  observed. No painful arc and no drop arm sign. No apprehension sign Lateral shoulder unremarkable  Neck: Inspection unremarkable. No palpable stepoffs. Negative Spurling's maneuver. Patient was are ready having some numbness in the pinky finger he states. Full neck range of motion Grip strength and sensation normal in bilateral hands Strength good C4 to T1 distribution No sensory change to C4 to T1 Negative Hoffman sign bilaterally Reflexes normal  Procedure: Real-time Ultrasound Guided Injection of left glenohumeral joint Device: GE Logiq E  Ultrasound guided injection is preferred based studies that show increased duration, increased effect, greater accuracy, decreased procedural pain, increased response rate with ultrasound guided versus blind injection.  Verbal informed consent obtained.  Time-out conducted.  Noted no overlying erythema, induration, or other signs of local infection.  Skin prepped in a sterile fashion.  Local anesthesia: Topical Ethyl chloride.  With sterile technique and under real time ultrasound guidance:  Joint visualized.  23g 1  inch needle inserted posterior approach. Pictures taken for needle placement. Patient did have injection of 2 cc of 1% lidocaine, 2 cc of 0.5% Marcaine, and 1cc of Kenalog 40 mg/dL. Completed without difficulty  Pain immediately resolved suggesting accurate placement of the medication.  Advised to call if fevers/chills, erythema, induration, drainage, or persistent bleeding.  Images permanently stored and available for review in the ultrasound unit.  Impression: Technically successful ultrasound guided injection.   Procedure: Real-time Ultrasound Guided Injection of left acromial clavicular joint Device: GE Logiq E  Ultrasound guided injection is preferred based studies that show increased duration, increased effect, greater accuracy, decreased procedural pain, increased response rate, and decreased cost with ultrasound guided  versus blind injection.  Verbal informed consent obtained.  Time-out conducted.  Noted no overlying erythema, induration, or other signs of local infection.  Skin prepped in a sterile fashion.  Local anesthesia: Topical Ethyl chloride.  With sterile technique and under real time ultrasound guidance:  With a 25-gauge 5 inch needle patient was injected with 0.5 mL of 0.5% Marcaine and 0.5 mL of Kenalog 40 mg/dL. Completed without difficulty  Pain immediately resolved suggesting accurate placement of the medication.  Advised to call if fevers/chills, erythema, induration, drainage, or persistent bleeding.  Images permanently stored and available for review in the ultrasound unit.  Impression: Technically successful ultrasound guided injection.     Impression and Recommendations:     This case required medical decision making of moderate complexity.

## 2015-04-11 NOTE — Patient Instructions (Signed)
Good to see you  Ice 20 minutes 2 times daily. Usually after activity and before bed. pennsaid pinkie amount topically 2 times daily as needed.  Exercises 3 times a week.  Can take ibuprofen 3 pills 3 times a day for 3 days See me when you need me.

## 2015-04-23 ENCOUNTER — Ambulatory Visit (INDEPENDENT_AMBULATORY_CARE_PROVIDER_SITE_OTHER): Payer: BC Managed Care – PPO | Admitting: Family Medicine

## 2015-04-23 VITALS — BP 130/90 | HR 104 | Temp 99.1°F | Ht 67.5 in | Wt 170.6 lb

## 2015-04-23 DIAGNOSIS — N3000 Acute cystitis without hematuria: Secondary | ICD-10-CM

## 2015-04-23 DIAGNOSIS — M545 Low back pain, unspecified: Secondary | ICD-10-CM

## 2015-04-23 DIAGNOSIS — R509 Fever, unspecified: Secondary | ICD-10-CM | POA: Diagnosis not present

## 2015-04-23 LAB — POCT URINALYSIS DIPSTICK
Bilirubin, UA: NEGATIVE
Glucose, UA: NEGATIVE
Ketones, UA: NEGATIVE
Nitrite, UA: POSITIVE
Spec Grav, UA: 1.02
Urobilinogen, UA: 0.2
pH, UA: 6.5

## 2015-04-23 LAB — POCT INFLUENZA A/B
Influenza A, POC: NEGATIVE
Influenza B, POC: NEGATIVE

## 2015-04-23 MED ORDER — CIPROFLOXACIN HCL 500 MG PO TABS: 500.0000 mg | ORAL_TABLET | Freq: Two times a day (BID) | ORAL | Status: DC

## 2015-04-23 MED ORDER — CEFTRIAXONE SODIUM 1 G IJ SOLR
1.0000 g | INTRAMUSCULAR | Status: DC
  Administered 2015-04-23: 1 g via INTRAMUSCULAR

## 2015-04-23 NOTE — Progress Notes (Signed)
   Subjective:    Patient ID: Donald Clark, male    DOB: 1960-09-14, 55 y.o.   MRN: [id number]  HPI Acute visit Patient's had 3 history of increased fatigue and body aches Also noted onset of some urine burning intermittently and frequency few days ago Past history of kidney stones. No gross hematuria. He's had some vague bilateral back pain and also suprapubic pain. Poor appetite past couple days. He thinks he may of had some low-grade fever earlier today. Initially thought he may have the flu. He denies any cough or sore throat. No alleviating or aggravating factors  No past history of known UTI.  Past Medical History  Diagnosis Date  . Chicken pox   . Depression   . GERD (gastroesophageal reflux disease)   . Allergy   . Chronic kidney disease     stones  . Prostatitis   . Cancer ([place])     skin- multiple removed-1 basal cell, 1 squamous cell  . Hypertension    Past Surgical History  Procedure Laterality Date  . Upper gastrointestinal endoscopy    . Colonoscopy    . Lithotripsy    . Skin cancer removed      on ankle, back neck    reports that he quit smoking about 34 years ago. His smoking use included Cigarettes. He has a 2 pack-year smoking history. He has never used smokeless tobacco. He reports that he drinks alcohol. He reports that he does not use illicit drugs. family history includes Cancer in his mother; Cancer (age of onset: 32) in his father; Colon cancer in his mother; Diabetes in his maternal grandfather and paternal grandmother; Hypertension in his mother; Rectal cancer in his father. There is no history of Esophageal cancer or Stomach cancer. Allergies  Allergen Reactions  . Penicillins     GI upset  . Sulfa Antibiotics     Hives, joint pain      Review of Systems  Constitutional: Positive for fever, chills and fatigue.  Respiratory: Negative for cough and shortness of breath.   Gastrointestinal: Positive for nausea and abdominal pain. Negative  for vomiting.  Genitourinary: Positive for dysuria and frequency. Negative for hematuria.  Psychiatric/Behavioral: Negative for confusion.       Objective:   Physical Exam  Constitutional: He appears well-developed and well-nourished.  HENT:  Mouth/Throat: Oropharynx is clear and moist.  Cardiovascular: Normal rate and regular rhythm.   Pulmonary/Chest: Effort normal and breath sounds normal. No respiratory distress. He has no wheezes. He has no rales.  Abdominal: Soft.  Mild suprapubic tenderness. No guarding or rebound  Musculoskeletal: He exhibits no edema.  Neurological: He is alert.          Assessment & Plan:  Patient presents with urinary symptoms above and urine dipstick highly suspicious for urinary tract infection. He does not appear septic but is having some nausea. We elected to go ahead with Rocephin 1 g IM given his nausea and then start Cipro 500 mg twice a day. Urine culture sent. Push hydration.  Patient tolerated Rocephin without difficulty.

## 2015-04-23 NOTE — Progress Notes (Signed)
Pre visit review using our clinic review tool, if applicable. No additional management support is needed unless otherwise documented below in the visit note. 

## 2015-04-23 NOTE — Patient Instructions (Signed)

## 2015-06-12 ENCOUNTER — Ambulatory Visit (INDEPENDENT_AMBULATORY_CARE_PROVIDER_SITE_OTHER): Payer: BC Managed Care – PPO | Admitting: Family Medicine

## 2015-06-12 VITALS — BP 130/92 | HR 78 | Temp 98.3°F | Ht 67.5 in | Wt 174.7 lb

## 2015-06-12 DIAGNOSIS — R197 Diarrhea, unspecified: Secondary | ICD-10-CM | POA: Diagnosis not present

## 2015-06-12 DIAGNOSIS — F331 Major depressive disorder, recurrent, moderate: Secondary | ICD-10-CM | POA: Diagnosis not present

## 2015-06-12 MED ORDER — BUPROPION HCL ER (XL) 150 MG PO TB24: 150.0000 mg | ORAL_TABLET | Freq: Every day | ORAL | Status: DC

## 2015-06-12 NOTE — Progress Notes (Signed)
Pre visit review using our clinic review tool, if applicable. No additional management support is needed unless otherwise documented below in the visit note. 

## 2015-06-12 NOTE — Progress Notes (Signed)
Subjective:    Patient ID: Donald Clark, male    DOB: 07-Feb-1961, 55 y.o.   MRN: [id number]  HPI Patient seen for the following issues  Recurrent depression symptoms for the past 4-6 weeks. No recent new stressors or other life events Long history of recurrent depression. Has responded well to Wellbutrin in the past. No suicidal ideation.  He has been tried on SSRI in past but did not tolerate well.  Patient relates about 2 month history of more frequent loose stools. He sometimes has up to 4 or 5 per day. He did take Cipro for UTI back in February and symptoms seem to start after that. Colonoscopy 2013. No bloody stools. Occasional lower abdominal cramping. No fevers or chills.  Past Medical History  Diagnosis Date  . Chicken pox   . Depression   . GERD (gastroesophageal reflux disease)   . Allergy   . Chronic kidney disease     stones  . Prostatitis   . Cancer ([place])     skin- multiple removed-1 basal cell, 1 squamous cell  . Hypertension    Past Surgical History  Procedure Laterality Date  . Upper gastrointestinal endoscopy    . Colonoscopy    . Lithotripsy    . Skin cancer removed      on ankle, back neck    reports that he quit smoking about 34 years ago. His smoking use included Cigarettes. He has a 2 pack-year smoking history. He has never used smokeless tobacco. He reports that he drinks alcohol. He reports that he does not use illicit drugs. family history includes Cancer in his mother; Cancer (age of onset: 43) in his father; Colon cancer in his mother; Diabetes in his maternal grandfather and paternal grandmother; Hypertension in his mother; Rectal cancer in his father. There is no history of Esophageal cancer or Stomach cancer. Allergies  Allergen Reactions  . Penicillins     GI upset  . Sulfa Antibiotics     Hives, joint pain      Review of Systems  Constitutional: Negative for fever, chills and unexpected weight change.  Respiratory: Negative for  shortness of breath.   Cardiovascular: Negative for chest pain.  Gastrointestinal: Positive for diarrhea. Negative for nausea and vomiting.  Genitourinary: Negative for dysuria.  Neurological: Negative for dizziness.  Hematological: Negative for adenopathy.  Psychiatric/Behavioral: Positive for dysphoric mood. Negative for suicidal ideas.       Objective:   Physical Exam  Constitutional: He appears well-developed and well-nourished.  HENT:  Mouth/Throat: Oropharynx is clear and moist.  Neck: Neck supple. No thyromegaly present.  Cardiovascular: Normal rate and regular rhythm.   Pulmonary/Chest: Effort normal and breath sounds normal. No respiratory distress. He has no wheezes. He has no rales.  Abdominal: Soft. Bowel sounds are normal. He exhibits no distension and no mass. There is no tenderness. There is no rebound and no guarding.  Neurological: He is alert. No cranial nerve deficit.  Psychiatric: He has a normal mood and affect. His behavior is normal.          Assessment & Plan:  #1 recurrent major depression, moderate severity. PH Q-9 score of 18. Start back Wellbutrin XL 150 mgs once daily. Reassess in one month He is not interested in counseling at this time.    #2 frequent loose stools over the past 2 months. Doubt C. difficile clinically but with recent antibiotics will check C. difficile PCR. Dietary factors discussed. Avoid Imodium at this time until  further investigated.

## 2015-06-12 NOTE — Patient Instructions (Signed)

## 2015-06-13 ENCOUNTER — Other Ambulatory Visit (INDEPENDENT_AMBULATORY_CARE_PROVIDER_SITE_OTHER): Payer: BC Managed Care – PPO

## 2015-06-13 DIAGNOSIS — Z0000 Encounter for general adult medical examination without abnormal findings: Secondary | ICD-10-CM

## 2015-06-13 LAB — CBC WITH DIFFERENTIAL/PLATELET
Basophils Absolute: 0 10*3/uL (ref 0.0–0.1)
Basophils Relative: 0.5 % (ref 0.0–3.0)
Eosinophils Absolute: 0.5 10*3/uL (ref 0.0–0.7)
Eosinophils Relative: 9.2 % — ABNORMAL HIGH (ref 0.0–5.0)
HCT: 44.6 % (ref 39.0–52.0)
Hemoglobin: 15.3 g/dL (ref 13.0–17.0)
Lymphocytes Relative: 22.3 % (ref 12.0–46.0)
Lymphs Abs: 1.1 10*3/uL (ref 0.7–4.0)
MCHC: 34.3 g/dL (ref 30.0–36.0)
MCV: 83.4 fl (ref 78.0–100.0)
Monocytes Absolute: 0.4 10*3/uL (ref 0.1–1.0)
Monocytes Relative: 7.3 % (ref 3.0–12.0)
Neutro Abs: 3 10*3/uL (ref 1.4–7.7)
Neutrophils Relative %: 60.7 % (ref 43.0–77.0)
Platelets: 209 10*3/uL (ref 150.0–400.0)
RBC: 5.34 Mil/uL (ref 4.22–5.81)
RDW: 13.5 % (ref 11.5–15.5)
WBC: 4.9 10*3/uL (ref 4.0–10.5)

## 2015-06-13 LAB — BASIC METABOLIC PANEL
BUN: 14 mg/dL (ref 6–23)
CO2: 29 mEq/L (ref 19–32)
Calcium: 9.4 mg/dL (ref 8.4–10.5)
Chloride: 105 mEq/L (ref 96–112)
Creatinine, Ser: 0.85 mg/dL (ref 0.40–1.50)
GFR: 99.31 mL/min (ref >60.00)
Glucose, Bld: 82 mg/dL (ref 70–99)
Potassium: 4 mEq/L (ref 3.5–5.1)
Sodium: 140 mEq/L (ref 135–145)

## 2015-06-13 LAB — LIPID PANEL
Cholesterol: 167 mg/dL (ref 0–200)
HDL: 50.3 mg/dL (ref >39.00)
LDL Cholesterol: 100 mg/dL — ABNORMAL HIGH (ref 0–99)
NonHDL: 116.7
Total CHOL/HDL Ratio: 3
Triglycerides: 85 mg/dL (ref 0.0–149.0)
VLDL: 17 mg/dL (ref 0.0–40.0)

## 2015-06-13 LAB — HEPATIC FUNCTION PANEL
ALT: 28 U/L (ref 0–53)
AST: 21 U/L (ref 0–37)
Albumin: 4.4 g/dL (ref 3.5–5.2)
Alkaline Phosphatase: 66 U/L (ref 39–117)
Bilirubin, Direct: 0.1 mg/dL (ref 0.0–0.3)
Total Bilirubin: 0.6 mg/dL (ref 0.2–1.2)
Total Protein: 6.4 g/dL (ref 6.0–8.3)

## 2015-06-13 LAB — PSA: PSA: 1.43 ng/mL (ref 0.10–4.00)

## 2015-06-13 LAB — TSH: TSH: 1.76 u[IU]/mL (ref 0.35–4.50)

## 2015-06-13 LAB — CLOSTRIDIUM DIFFICILE BY PCR: Toxigenic C. Difficile by PCR: NOT DETECTED

## 2015-07-12 ENCOUNTER — Ambulatory Visit (INDEPENDENT_AMBULATORY_CARE_PROVIDER_SITE_OTHER): Payer: BC Managed Care – PPO | Admitting: Family Medicine

## 2015-07-12 ENCOUNTER — Encounter: Payer: Self-pay | Admitting: Family Medicine

## 2015-07-12 VITALS — BP 120/80 | HR 89 | Temp 98.7°F | Ht 67.5 in | Wt 174.0 lb

## 2015-07-12 DIAGNOSIS — Z0000 Encounter for general adult medical examination without abnormal findings: Secondary | ICD-10-CM | POA: Diagnosis not present

## 2015-07-12 NOTE — Patient Instructions (Signed)
Repeat colonoscopy by next year.   

## 2015-07-12 NOTE — Progress Notes (Signed)
Subjective:    Patient ID: Donald Clark, male    DOB: 23-Jul-1960, 55 y.o.   MRN: [id number]  HPI Patient seen for physical exam Recently started back Wellbutrin for depression symptoms. He is on low-dose 150 mg once daily and feels his depression symptoms have improved. Hypertension treated with amlodipine. He has history of kidney stones and scheduled follow-up with urology. Both parents had colon cancer. Most recent colonoscopy 2013 with recommended five-year follow-up. No recent stool changes.  Nonsmoker. Walk some for exercise. Tetanus up-to-date.  Past Medical History  Diagnosis Date  . Chicken pox   . Depression   . GERD (gastroesophageal reflux disease)   . Allergy   . Chronic kidney disease     stones  . Prostatitis   . Cancer ([place])     skin- multiple removed-1 basal cell, 1 squamous cell  . Hypertension    Past Surgical History  Procedure Laterality Date  . Upper gastrointestinal endoscopy    . Colonoscopy    . Lithotripsy    . Skin cancer removed      on ankle, back neck    reports that he quit smoking about 34 years ago. His smoking use included Cigarettes. He has a 2 pack-year smoking history. He has never used smokeless tobacco. He reports that he drinks alcohol. He reports that he does not use illicit drugs. family history includes Cancer in his mother; Cancer (age of onset: 27) in his father; Colon cancer in his mother; Diabetes in his maternal grandfather and paternal grandmother; Hypertension in his mother; Rectal cancer in his father. There is no history of Esophageal cancer or Stomach cancer. Allergies  Allergen Reactions  . Penicillins     GI upset  . Sulfa Antibiotics     Hives, joint pain      Review of Systems  Constitutional: Negative for fever, activity change, appetite change and fatigue.  HENT: Negative for congestion, ear pain and trouble swallowing.   Eyes: Negative for pain and visual disturbance.  Respiratory: Negative for cough,  shortness of breath and wheezing.   Cardiovascular: Negative for chest pain and palpitations.  Gastrointestinal: Negative for nausea, vomiting, abdominal pain, diarrhea, constipation, blood in stool, abdominal distention and rectal pain.  Genitourinary: Negative for dysuria, hematuria and testicular pain.  Musculoskeletal: Negative for joint swelling and arthralgias.  Skin: Negative for rash.  Neurological: Negative for dizziness, syncope and headaches.  Hematological: Negative for adenopathy.  Psychiatric/Behavioral: Negative for confusion and dysphoric mood.       Objective:   Physical Exam  Constitutional: He is oriented to person, place, and time. He appears well-developed and well-nourished. No distress.  HENT:  Head: Normocephalic and atraumatic.  Right Ear: External ear normal.  Left Ear: External ear normal.  Mouth/Throat: Oropharynx is clear and moist.  Eyes: Conjunctivae and EOM are normal. Pupils are equal, round, and reactive to light.  Neck: Normal range of motion. Neck supple. No thyromegaly present.  Cardiovascular: Normal rate, regular rhythm and normal heart sounds.   No murmur heard. Pulmonary/Chest: No respiratory distress. He has no wheezes. He has no rales.  Abdominal: Soft. Bowel sounds are normal. He exhibits no distension and no mass. There is no tenderness. There is no rebound and no guarding.  Musculoskeletal: He exhibits no edema.  Lymphadenopathy:    He has no cervical adenopathy.  Neurological: He is alert and oriented to person, place, and time. He displays normal reflexes. No cranial nerve deficit.  Skin: No rash noted.  He has a few scattered angiomas but no concerning skin lesions.  Psychiatric: He has a normal mood and affect.          Assessment & Plan:  Physical exam. Labs reviewed. No major concerns. Depression symptoms improved with low-dose Wellbutrin. Blood pressure well controlled. Continue amlodipine. Discussed healthy exercise habits.  Needs repeat colonoscopy in one year.  Eulas Post MD [facility]

## 2015-07-12 NOTE — Progress Notes (Signed)
Pre visit review using our clinic review tool, if applicable. No additional management support is needed unless otherwise documented below in the visit note. 

## 2015-08-21 ENCOUNTER — Other Ambulatory Visit: Payer: Self-pay | Admitting: Family Medicine

## 2015-11-21 ENCOUNTER — Other Ambulatory Visit: Payer: Self-pay | Admitting: Urology

## 2015-11-21 ENCOUNTER — Encounter (HOSPITAL_COMMUNITY): Payer: Self-pay | Admitting: *Deleted

## 2015-11-25 ENCOUNTER — Ambulatory Visit (HOSPITAL_COMMUNITY): Admission: RE | Admit: 2015-11-25 | Payer: BC Managed Care – PPO | Source: Ambulatory Visit | Admitting: Urology

## 2015-11-25 SURGERY — LITHOTRIPSY, ESWL
Anesthesia: LOCAL | Laterality: Right

## 2015-12-20 ENCOUNTER — Telehealth: Payer: Self-pay | Admitting: Family Medicine

## 2015-12-20 NOTE — Telephone Encounter (Signed)
Pts wife would like become a patient of your will you accept her?

## 2015-12-20 NOTE — Telephone Encounter (Signed)
Pt has been scheduled.  °

## 2015-12-20 NOTE — Telephone Encounter (Signed)
yes

## 2015-12-25 ENCOUNTER — Other Ambulatory Visit: Payer: Self-pay | Admitting: Family Medicine

## 2016-01-20 ENCOUNTER — Emergency Department (HOSPITAL_COMMUNITY)
Admission: EM | Admit: 2016-01-20 | Discharge: 2016-01-21 | Disposition: A | Discharge status: Home / Self Care | Payer: BC Managed Care – PPO | Attending: Emergency Medicine | Admitting: Emergency Medicine

## 2016-01-20 ENCOUNTER — Emergency Department (HOSPITAL_COMMUNITY): Payer: BC Managed Care – PPO

## 2016-01-20 ENCOUNTER — Other Ambulatory Visit: Payer: Self-pay

## 2016-01-20 DIAGNOSIS — Z87891 Personal history of nicotine dependence: Secondary | ICD-10-CM | POA: Insufficient documentation

## 2016-01-20 DIAGNOSIS — Z85038 Personal history of other malignant neoplasm of large intestine: Secondary | ICD-10-CM | POA: Insufficient documentation

## 2016-01-20 DIAGNOSIS — R072 Precordial pain: Secondary | ICD-10-CM | POA: Diagnosis present

## 2016-01-20 DIAGNOSIS — I129 Hypertensive chronic kidney disease with stage 1 through stage 4 chronic kidney disease, or unspecified chronic kidney disease: Secondary | ICD-10-CM | POA: Insufficient documentation

## 2016-01-20 DIAGNOSIS — K219 Gastro-esophageal reflux disease without esophagitis: Secondary | ICD-10-CM

## 2016-01-20 DIAGNOSIS — Z79899 Other long term (current) drug therapy: Secondary | ICD-10-CM | POA: Diagnosis not present

## 2016-01-20 DIAGNOSIS — Z85828 Personal history of other malignant neoplasm of skin: Secondary | ICD-10-CM | POA: Diagnosis not present

## 2016-01-20 DIAGNOSIS — N189 Chronic kidney disease, unspecified: Secondary | ICD-10-CM | POA: Insufficient documentation

## 2016-01-20 LAB — BASIC METABOLIC PANEL
Anion gap: 6 (ref 5–15)
BUN: 16 mg/dL (ref 6–20)
CO2: 29 mmol/L (ref 22–32)
Calcium: 9.7 mg/dL (ref 8.9–10.3)
Chloride: 107 mmol/L (ref 101–111)
Creatinine, Ser: 1.01 mg/dL (ref 0.61–1.24)
GFR calc Af Amer: >60 mL/min (ref >60)
GFR calc non Af Amer: >60 mL/min (ref >60)
Glucose, Bld: 95 mg/dL (ref 65–99)
Potassium: 3.8 mmol/L (ref 3.5–5.1)
Sodium: 142 mmol/L (ref 135–145)

## 2016-01-20 LAB — CBC
HCT: 42.8 % (ref 39.0–52.0)
Hemoglobin: 14.8 g/dL (ref 13.0–17.0)
MCH: 28.5 pg (ref 26.0–34.0)
MCHC: 34.6 g/dL (ref 30.0–36.0)
MCV: 82.5 fL (ref 78.0–100.0)
Platelets: 238 10*3/uL (ref 150–400)
RBC: 5.19 MIL/uL (ref 4.22–5.81)
RDW: 13.1 % (ref 11.5–15.5)
WBC: 12.2 10*3/uL — ABNORMAL HIGH (ref 4.0–10.5)

## 2016-01-20 LAB — I-STAT TROPONIN, ED: Troponin i, poc: 0 ng/mL (ref 0.00–0.08)

## 2016-01-20 MED ORDER — GI COCKTAIL ~~LOC~~
30.0000 mL | Freq: Once | ORAL | Status: AC
  Administered 2016-01-21: 30 mL via ORAL
  Filled 2016-01-20: qty 30

## 2016-01-20 NOTE — Progress Notes (Signed)
[facility] [address], [place] [address] Phone: [phone] Subjective:     CC: Shoulder pain. follow-up  [id number]  Donald Clark is a 55 y.o. male coming in with complaint of shoulder pain. Had questionable tear of labrum and moderate osteophytic changes of the acromial clavicular joint. Given an injection in the glenohumeral and acromioclavicular joint. This was 9 months ago. Patient states Pain started coming back over the course last 3 weeks. Started having increasing difficulty with even daily activities such as putting on her taking off at home. Patient rates the severity pain is 8 out of 10. Waking him up at night. Did recently have a fall approximately 3 weeks, that could've exacerbated the problem. Asian denies any neck pain. States that there is mild increase in weakness.  Past Medical History:  Diagnosis Date  . Allergy   . Cancer ([place])    skin- multiple removed-1 basal cell, 1 squamous cell  . Chicken pox   . Chronic kidney disease    stones  . Depression   . GERD (gastroesophageal reflux disease)   . Hypertension   . Prostatitis    Past Surgical History:  Procedure Laterality Date  . COLONOSCOPY    . LITHOTRIPSY    . skin cancer removed     on ankle, back neck  . UPPER GASTROINTESTINAL ENDOSCOPY     Social History  Substance Use Topics  . Smoking status: Former Smoker    Packs/day: 0.50    Years: 4.00    Types: Cigarettes    Quit date: 10/13/1980  . Smokeless tobacco: Never Used  . Alcohol use 0.0 oz/week     Comment: occ   Allergies  Allergen Reactions  . Penicillins     GI upset Has patient had a PCN reaction causing immediate rash, facial/tongue/throat swelling, SOB or lightheadedness with hypotension: No Has patient had a PCN reaction causing severe rash involving mucus membranes or skin necrosis: No Has patient had a PCN reaction that required hospitalization no Has patient had a PCN reaction  occurring within the last 10 years: Yes If all of the above answers are "NO", then may proceed with Cephalosporin use.   . Sulfa Antibiotics     Hives, joint pain, rash   Family History  Problem Relation Age of Onset  . Cancer Mother     colon, uterine CA  . Hypertension Mother   . Colon cancer Mother   . Cancer Father 59    colon cancer  . Rectal cancer Father   . Diabetes Maternal Grandfather   . Diabetes Paternal Grandmother   . Esophageal cancer Neg Hx   . Stomach cancer Neg Hx    .     Past medical history, social, surgical and family history all reviewed in electronic medical record.   Review of Systems: No headache, visual changes, nausea, vomiting, diarrhea, constipation, dizziness, abdominal pain, skin rash, fevers, chills, night sweats, weight loss, swollen lymph nodes, body aches, joint swelling, muscle aches, chest pain, shortness of breath, mood changes.   Objective  Blood pressure 108/74, pulse 78, height 5\' 7"  (1.702 m), weight 171 lb (77.6 kg), SpO2 97 %.  Systems examined below as of 01/21/16 General: NAD A&O x3 mood, affect normal  HEENT: Pupils equal, extraocular movements intact no nystagmus Respiratory: not short of breath at rest or with speaking Cardiovascular: No lower extremity edema, non tender Skin: Warm dry intact with no signs of infection or rash on  extremities or on axial skeleton. Abdomen: Soft nontender, no masses Neuro: Cranial nerves  intact, neurovascularly intact in all extremities with 2+ DTRs and 2+ pulses. Lymph: No lymphadenopathy appreciated today  Gait normal with good balance and coordination.  MSK: Non tender with full range of motion and good stability and symmetric strength and tone of , elbows, wrist,  knee hips and ankles bilaterally.   and tone of shoulders, elbows, wrist, hip, knee and ankles bilaterally.  Shoulder: left Mild increase in atrophy of the musculature from previous exam Tender over the acromioclavicular joint  as well as somewhat over the glenohumeral joint ROM is full in all planes passively. I'll positive impingement signs of impingement with positive Neer and Hawkin's tests, but negative empty can sign. Speeds and Yergason's tests normal.  labral pathology noted with positive Obrien's, negative clunk and good stability. Normal scapular function observed. No painful arc and no drop arm sign. No apprehension sign Lateral shoulder unremarkable  Neck: Inspection unremarkable. No palpable stepoffs. Negative Spurling's maneuver. Patient was are ready having some numbness in the pinky finger he states. Full neck range of motion Grip strength and sensation normal in bilateral hands Strength good C4 to T1 distribution No sensory change to C4 to T1 Negative Hoffman sign bilaterally Reflexes normal  Procedure: Real-time Ultrasound Guided Injection of left glenohumeral joint Device: GE Logiq E  Ultrasound guided injection is preferred based studies that show increased duration, increased effect, greater accuracy, decreased procedural pain, increased response rate with ultrasound guided versus blind injection.  Verbal informed consent obtained.  Time-out conducted.  Noted no overlying erythema, induration, or other signs of local infection.  Skin prepped in a sterile fashion.  Local anesthesia: Topical Ethyl chloride.  With sterile technique and under real time ultrasound guidance:  Joint visualized.  23g 1  inch needle inserted posterior approach. Pictures taken for needle placement. Patient did have injection of 2 cc of 1% lidocaine, 2 cc of 0.5% Marcaine, and 1cc of Kenalog 40 mg/dL. Completed without difficulty  Pain immediately resolved suggesting accurate placement of the medication.  Advised to call if fevers/chills, erythema, induration, drainage, or persistent bleeding.  Images permanently stored and available for review in the ultrasound unit.  Impression: Technically successful  ultrasound guided injection.   Procedure: Real-time Ultrasound Guided Injection of left acromioclavicular joint Device: GE Logiq E  Ultrasound guided injection is preferred based studies that show increased duration, increased effect, greater accuracy, decreased procedural pain, increased response rate, and decreased cost with ultrasound guided versus blind injection.  Verbal informed consent obtained.  Time-out conducted.  Noted no overlying erythema, induration, or other signs of local infection.  Skin prepped in a sterile fashion.  Local anesthesia: Topical Ethyl chloride.  With sterile technique and under real time ultrasound guidance:  With a 25-gauge half-inch needle patient was injected with a total of 0.5 mL of 0.5% Marcaine and 0.5 mL of Kenalog 40 mg/dL. Completed without difficulty  Pain immediately resolved suggesting accurate placement of the medication.  Advised to call if fevers/chills, erythema, induration, drainage, or persistent bleeding.  Images permanently stored and available for review in the ultrasound unit.  Impression: Technically successful ultrasound guided injection..     Impression and Recommendations:     This case required medical decision making of moderate complexity.

## 2016-01-20 NOTE — ED Triage Notes (Signed)
Pt states that approx. 1 hour ago he started having chest pressure with SOB and pain in his throat and neck. Airway intact. Also c/o L arm pain but this is from an existing shoulder injury. Alert and oriented.

## 2016-01-20 NOTE — ED Provider Notes (Signed)
[place] DEPT Provider Note   CSN: [id number] Arrival date & time: 01/20/16  2202  By signing my name below, I, Donald Clark, attest that this documentation has been prepared under the direction and in the presence of Asianae Minkler, MD.  Electronically Signed: Julien Clark, ED Scribe. 01/20/16. 11:49 PM.    History   Chief Complaint Chief Complaint  Patient presents with  . Chest Pain    The history is provided by the patient. No language interpreter was used.  Chest Pain   This is a new problem. The current episode started 3 to 5 hours ago. The problem occurs rarely. The problem has been gradually worsening. The pain is associated with breathing. The pain is present in the substernal region. The pain is moderate. The quality of the pain is described as sharp. The pain does not radiate. The symptoms are aggravated by deep breathing. Associated symptoms include shortness of breath. Pertinent negatives include no nausea and no vomiting. He has tried antacids for the symptoms. The treatment provided no relief. There are no known risk factors.  His past medical history is significant for cancer.   HPI Comments: Donald Clark is a 55 y.o. male who has a PMhx of presents to the Emergency Department complaining of sudden onset, moderate, central chest pain x 3 hours ago. He notes feeling mild SOB. Pt says his pain is worse when he breathes in. Pt has just finished eating a chick fil a sandwich, fries, and tea prior to his chest pain starting. He notes that halfway through eating his sandwich, he had mild difficulty swallowing. Pt has a hx of this problem in the past but notes it does not occur often. He notes having problems with acid reflux in the past so he took pepcid, tums, and aspirin to relieve his pain without any relief. Pt further expresses lifting a safe many hours prior to his chest pain starting. He has a hx of having his esophagus dilated many years ago. Denies vomiting,  diarrhea, constipation, cough, or prolonged travel.  Past Medical History:  Diagnosis Date  . Allergy   . Cancer ([place])    skin- multiple removed-1 basal cell, 1 squamous cell  . Chicken pox   . Chronic kidney disease    stones  . Depression   . GERD (gastroesophageal reflux disease)   . Hypertension   . Prostatitis     Patient Active Problem List   Diagnosis Date Noted  . Numbness of finger 04/11/2015  . Labral tear of shoulder 01/29/2014  . Acromioclavicular joint arthritis 01/29/2014  . Chest pain 09/04/2012  . Dog bite of hand 09/04/2012  . Hypertension 10/14/2010  . Kidney stones 10/14/2010  . GERD (gastroesophageal reflux disease) 10/14/2010  . History of depression 10/14/2010  . Family history of colon cancer 10/14/2010    Past Surgical History:  Procedure Laterality Date  . COLONOSCOPY    . LITHOTRIPSY    . skin cancer removed     on ankle, back neck  . UPPER GASTROINTESTINAL ENDOSCOPY         Home Medications    Prior to Admission medications   Medication Sig Start Date End Date Taking? Authorizing Provider  amLODipine (NORVASC) 10 MG tablet TAKE ONE TABLET BY MOUTH ONCE DAILY 08/21/15   Eulas Post, MD  buPROPion (WELLBUTRIN XL) 150 MG 24 hr tablet TAKE ONE TABLET BY MOUTH ONCE DAILY 12/25/15   Eulas Post, MD  loratadine (CLARITIN) 10 MG tablet Take 10  mg by mouth daily as needed.     Historical Provider, MD  Misc Natural Products (GINSENG COMPLEX PO) Take by mouth daily.    Historical Provider, MD  Multiple Vitamin (MULTIVITAMIN WITH MINERALS) TABS Take 1 tablet by mouth daily. Reported on 06/12/2015    Historical Provider, MD  tadalafil (CIALIS) 5 MG tablet Take 2.5 mg by mouth daily.    Historical Provider, MD  vitamin B-12 (CYANOCOBALAMIN) 50 MCG tablet Take 50 mcg by mouth daily.    Historical Provider, MD    Family History Family History  Problem Relation Age of Onset  . Cancer Mother     colon, uterine CA  . Hypertension Mother     . Colon cancer Mother   . Cancer Father 80    colon cancer  . Rectal cancer Father   . Diabetes Maternal Grandfather   . Diabetes Paternal Grandmother   . Esophageal cancer Neg Hx   . Stomach cancer Neg Hx     Social History Social History  Substance Use Topics  . Smoking status: Former Smoker    Packs/day: 0.50    Years: 4.00    Types: Cigarettes    Quit date: 10/13/1980  . Smokeless tobacco: Never Used  . Alcohol use 0.0 oz/week     Comment: occ     Allergies   Penicillins and Sulfa antibiotics   Review of Systems Review of Systems  Respiratory: Positive for shortness of breath.   Cardiovascular: Positive for chest pain.  Gastrointestinal: Negative for constipation, diarrhea, nausea and vomiting.  All other systems reviewed and are negative.    Physical Exam Updated Vital Signs BP 141/92 (BP Location: Right Arm)   Pulse 85   Temp 98.4 F (36.9 C) (Oral)   Resp 18   SpO2 100%   Physical Exam  Constitutional: He is oriented to person, place, and time. He appears well-developed and well-nourished.  HENT:  Head: Normocephalic and atraumatic.  Mouth/Throat: Oropharynx is clear and moist. No oropharyngeal exudate.  Eyes: Conjunctivae and EOM are normal. Pupils are equal, round, and reactive to light.  Neck: Normal range of motion. Neck supple. No JVD present. No tracheal deviation present.  No carotid bruits. Trachea midline.   Cardiovascular: Normal rate, regular rhythm and normal heart sounds.  Exam reveals no gallop and no friction rub.   No murmur heard. RRR.   Pulmonary/Chest: Effort normal and breath sounds normal. No stridor. No respiratory distress. He has no wheezes. He has no rales.  Lungs CTA bilaterally.   Abdominal: Soft. He exhibits no distension. There is no rebound and no guarding.  Extremely hyperactive; stool in ascending and descending transverse coli   Musculoskeletal: Normal range of motion.  Lymphadenopathy:    He has no cervical  adenopathy.  Neurological: He is alert and oriented to person, place, and time. He has normal reflexes.  All compartments soft, no edema  Skin: Skin is warm and dry.  Psychiatric: He has a normal mood and affect.  Nursing note and vitals reviewed.    ED Treatments / Results  DIAGNOSTIC STUDIES: Oxygen Saturation is 100% on RA, normal by my interpretation.  COORDINATION OF CARE:  11:45 PM Discussed treatment plan with pt at bedside and pt agreed to plan.  Labs (all labs ordered are listed, but only abnormal results are displayed) Labs Reviewed  CBC - Abnormal; Notable for the following:       Result Value   WBC 12.2 (*)    All other components  within normal limits  BASIC METABOLIC PANEL  I-STAT TROPOININ, ED    EKG  EKG Interpretation  Date/Time:  Monday January 20 2016 22:11:09 EST Ventricular Rate:  85 PR Interval:    QRS Duration: 90 QT Interval:  360 QTC Calculation: 428 R Axis:   15 Text Interpretation:  Sinus rhythm No significant change since last tracing Confirmed by Winfred Leeds  MD, SAM [phone]) on 01/20/2016 10:21:03 PM       Radiology Dg Chest 2 View  Result Date: 01/20/2016 CLINICAL DATA:  Acute onset of upper mid chest pain. High blood pressure. Initial encounter. EXAM: CHEST  2 VIEW COMPARISON:  Chest radiograph performed 09/04/2012 FINDINGS: The lungs are well-aerated. Mild vascular congestion is noted. There is no evidence of focal opacification, pleural effusion or pneumothorax. Bilateral calcified granulomata are suggested. A 1.3 cm left midlung nodular opacity has increased only slightly in size from 2014 and likely reflects a calcified granuloma. The heart is normal in size; the mediastinal contour is within normal limits. No acute osseous abnormalities are seen. IMPRESSION: Mild vascular congestion noted.  Lungs remain grossly clear. Electronically Signed   By: Garald Balding M.D.   On: 01/20/2016 22:24    Procedures Procedures (including critical  care time)  Medications Ordered in ED Medications  gi cocktail (Maalox,Lidocaine,Donnatal) (30 mLs Oral Given 01/21/16 0003)  ketorolac (TORADOL) injection 60 mg (60 mg Intramuscular Given 01/21/16 0011)   HEART score is 1 low risk for MACE 2 negative troponins in the ED.  Refused 3rd.  Symptoms consistent with GERD as food got stuck on the way down.  Symptoms highly atypical for ACS.  will treat for same and refer to GI.     New Prescriptions New Prescriptions   No medications on file  All questions answered to patient's satisfaction. Based on history and exam patient has been appropriately medically screened and emergency conditions excluded. Patient is stable for discharge at this time. Follow up with your PMD for recheck in 2 days and strict return precautions given.       Veatrice Kells, MD 01/21/16 [phone]

## 2016-01-20 NOTE — ED Notes (Signed)
Patient transported to X-ray 

## 2016-01-20 NOTE — ED Notes (Signed)
Pt c/o of midsternal chest pain  5/10 described as constant pressure that becomes a sharp pain with deep breathing Pt states that the discomfort started around 9pm. Pt denies pain radiating, SOB.

## 2016-01-21 ENCOUNTER — Ambulatory Visit: Payer: Self-pay

## 2016-01-21 ENCOUNTER — Encounter (HOSPITAL_COMMUNITY): Payer: Self-pay | Admitting: Emergency Medicine

## 2016-01-21 ENCOUNTER — Ambulatory Visit (INDEPENDENT_AMBULATORY_CARE_PROVIDER_SITE_OTHER): Payer: BC Managed Care – PPO | Admitting: Family Medicine

## 2016-01-21 ENCOUNTER — Encounter: Payer: Self-pay | Admitting: Family Medicine

## 2016-01-21 VITALS — BP 108/74 | HR 78 | Ht 67.0 in | Wt 171.0 lb

## 2016-01-21 DIAGNOSIS — M19012 Primary osteoarthritis, left shoulder: Secondary | ICD-10-CM | POA: Diagnosis not present

## 2016-01-21 DIAGNOSIS — M25512 Pain in left shoulder: Secondary | ICD-10-CM

## 2016-01-21 DIAGNOSIS — G8929 Other chronic pain: Secondary | ICD-10-CM

## 2016-01-21 DIAGNOSIS — M19019 Primary osteoarthritis, unspecified shoulder: Secondary | ICD-10-CM | POA: Diagnosis not present

## 2016-01-21 LAB — I-STAT TROPONIN, ED: Troponin i, poc: 0 ng/mL (ref 0.00–0.08)

## 2016-01-21 MED ORDER — TIZANIDINE HCL 4 MG PO TABS: 4.0000 mg | ORAL_TABLET | Freq: Four times a day (QID) | ORAL | 0 refills | Status: DC | PRN

## 2016-01-21 MED ORDER — KETOROLAC TROMETHAMINE 60 MG/2ML IM SOLN
60.0000 mg | Freq: Once | INTRAMUSCULAR | Status: AC
  Administered 2016-01-21: 60 mg via INTRAMUSCULAR
  Filled 2016-01-21: qty 2

## 2016-01-21 MED ORDER — OMEPRAZOLE 20 MG PO CPDR: 20.0000 mg | DELAYED_RELEASE_CAPSULE | Freq: Every day | ORAL | 0 refills | Status: DC

## 2016-01-21 MED ORDER — GABAPENTIN 100 MG PO CAPS: 200.0000 mg | ORAL_CAPSULE | Freq: Every day | ORAL | 3 refills | Status: DC

## 2016-01-21 MED ORDER — VITAMIN D (ERGOCALCIFEROL) 1.25 MG (50000 UNIT) PO CAPS: 50000.0000 [IU] | ORAL_CAPSULE | ORAL | 0 refills | Status: DC

## 2016-01-21 NOTE — Assessment & Plan Note (Signed)
Worsening symptoms. Given an injection . Tolerated the procedure. Discussed icing, home exercises, discussed [employer] to measure would be shoulder replacement with patients age we wouldn't avoid that if possible. Patient will come back and see me again in 4 weeks.

## 2016-01-21 NOTE — Patient Instructions (Signed)
Good to see you  I hope this gives you at least 9 months again  We will get you in with physical therapy  Ice 20 minutes 2 times daily. Usually after activity and before bed. Stay active.  Keep hands within peripheral vision Gabapentin 200mg  at night Zanaflex up to 3 times a day for tightness in muscles Once weekly vitamin D Pennsaid as needed See me again in 4 weeks if not great

## 2016-01-21 NOTE — Assessment & Plan Note (Signed)
Patient given injection today. Tolerated the procedure well. Continue vitamin D, gabapentin, icing regimen. Discussed lifting mechanics. Follow-up again in 4-6 weeks. Can repeat every 3-4 months injections.

## 2016-01-21 NOTE — ED Notes (Signed)
Writer went to draw blood work for a repeat [facility], pt refused the blood draws, sts that he feels better after the meds, Primary school teacher and EDP [place]

## 2016-01-22 ENCOUNTER — Encounter: Payer: Self-pay | Admitting: Gastroenterology

## 2016-03-02 DIAGNOSIS — Z8669 Personal history of other diseases of the nervous system and sense organs: Secondary | ICD-10-CM

## 2016-03-02 HISTORY — DX: Personal history of other diseases of the nervous system and sense organs: Z86.69

## 2016-06-15 ENCOUNTER — Other Ambulatory Visit: Payer: Self-pay | Admitting: Family Medicine

## 2016-08-20 ENCOUNTER — Ambulatory Visit (INDEPENDENT_AMBULATORY_CARE_PROVIDER_SITE_OTHER): Payer: BC Managed Care – PPO | Admitting: Adult Health

## 2016-08-20 ENCOUNTER — Encounter: Payer: Self-pay | Admitting: Adult Health

## 2016-08-20 VITALS — BP 140/80 | Temp 98.6°F | Ht 67.0 in | Wt 180.3 lb

## 2016-08-20 DIAGNOSIS — R51 Headache: Secondary | ICD-10-CM

## 2016-08-20 DIAGNOSIS — R519 Headache, unspecified: Secondary | ICD-10-CM

## 2016-08-20 LAB — CBC WITH DIFFERENTIAL/PLATELET
Basophils Absolute: 0 10*3/uL (ref 0.0–0.1)
Basophils Relative: 0.9 % (ref 0.0–3.0)
Eosinophils Absolute: 0.1 10*3/uL (ref 0.0–0.7)
Eosinophils Relative: 2.8 % (ref 0.0–5.0)
HCT: 46.2 % (ref 39.0–52.0)
Hemoglobin: 15.6 g/dL (ref 13.0–17.0)
Lymphocytes Relative: 23.6 % (ref 12.0–46.0)
Lymphs Abs: 1.2 10*3/uL (ref 0.7–4.0)
MCHC: 33.7 g/dL (ref 30.0–36.0)
MCV: 84.7 fl (ref 78.0–100.0)
Monocytes Absolute: 0.4 10*3/uL (ref 0.1–1.0)
Monocytes Relative: 7.8 % (ref 3.0–12.0)
Neutro Abs: 3.2 10*3/uL (ref 1.4–7.7)
Neutrophils Relative %: 64.9 % (ref 43.0–77.0)
Platelets: 199 10*3/uL (ref 150.0–400.0)
RBC: 5.45 Mil/uL (ref 4.22–5.81)
RDW: 12.7 % (ref 11.5–15.5)
WBC: 4.9 10*3/uL (ref 4.0–10.5)

## 2016-08-20 LAB — BASIC METABOLIC PANEL
BUN: 14 mg/dL (ref 6–23)
CO2: 31 mEq/L (ref 19–32)
Calcium: 9.6 mg/dL (ref 8.4–10.5)
Chloride: 105 mEq/L (ref 96–112)
Creatinine, Ser: 0.95 mg/dL (ref 0.40–1.50)
GFR: 86.97 mL/min (ref >60.00)
Glucose, Bld: 99 mg/dL (ref 70–99)
Potassium: 4 mEq/L (ref 3.5–5.1)
Sodium: 141 mEq/L (ref 135–145)

## 2016-08-20 MED ORDER — KETOROLAC TROMETHAMINE 60 MG/2ML IM SOLN
60.0000 mg | Freq: Once | INTRAMUSCULAR | Status: AC
  Administered 2016-08-20: 60 mg via INTRAMUSCULAR

## 2016-08-20 MED ORDER — CYCLOBENZAPRINE HCL 10 MG PO TABS: 10.0000 mg | ORAL_TABLET | Freq: Every day | ORAL | 0 refills | Status: DC

## 2016-08-20 NOTE — Progress Notes (Signed)
Subjective:    Patient ID: Donald Clark, male    DOB: 07/08/1960, 56 y.o.   MRN: [id number]  HPI  56 year old male who  has a past medical history of Allergy; Cancer ([place]); Chicken pox; Chronic kidney disease; Depression; GERD (gastroesophageal reflux disease); Hypertension; and Prostatitis. He is a patient of Dr. Elease Hashimoto who I am seeing today for the complaint of constant headache x 1- 1.5 months. Per patient he has been experiencing headaches daily. He originally thought it was sinus or allergy related and was taking various OTC medications. His headaches continued and he started to have visual changes, he reports " I had a noticeable change in vision, it was like my vision was blurred and my vision was weak." he has also had episodes where " I will have migraine like pain and the vision in my right eye will go out completely."    The last time his vision went out completely was one month ago. At this time he used one of his daughters Imitrex and his symptoms resolved.   Most of headaches start in the base of the skull and radiate up his head. He feels as though his muscles are tight in his neck.   Denies any nausea, vomiting, or syncopal episodes   Review of Systems See HPI   Past Medical History:  Diagnosis Date  . Allergy   . Cancer ([place])    skin- multiple removed-1 basal cell, 1 squamous cell  . Chicken pox   . Chronic kidney disease    stones  . Depression   . GERD (gastroesophageal reflux disease)   . Hypertension   . Prostatitis     Social History   Social History  . Marital status: Married    Spouse name: N/A  . Number of children: N/A  . Years of education: N/A   Occupational History  . Not on file.   Social History Main Topics  . Smoking status: Former Smoker    Packs/day: 0.50    Years: 4.00    Types: Cigarettes    Quit date: 10/13/1980  . Smokeless tobacco: Never Used  . Alcohol use 0.0 oz/week     Comment: occ  . Drug use: No  . Sexual  activity: Yes   Other Topics Concern  . Not on file   Social History Narrative  . No narrative on file    Past Surgical History:  Procedure Laterality Date  . COLONOSCOPY    . LITHOTRIPSY    . skin cancer removed     on ankle, back neck  . UPPER GASTROINTESTINAL ENDOSCOPY      Family History  Problem Relation Age of Onset  . Cancer Mother        colon, uterine CA  . Hypertension Mother   . Colon cancer Mother   . Cancer Father 43       colon cancer  . Rectal cancer Father   . Diabetes Maternal Grandfather   . Diabetes Paternal Grandmother   . Esophageal cancer Neg Hx   . Stomach cancer Neg Hx     Allergies  Allergen Reactions  . Penicillins     GI upset Has patient had a PCN reaction causing immediate rash, facial/tongue/throat swelling, SOB or lightheadedness with hypotension: No Has patient had a PCN reaction causing severe rash involving mucus membranes or skin necrosis: No Has patient had a PCN reaction that required hospitalization no Has patient had a PCN reaction occurring within the  last 10 years: Yes If all of the above answers are "NO", then may proceed with Cephalosporin use.   . Sulfa Antibiotics     Hives, joint pain, rash    Current Outpatient Prescriptions on File Prior to Visit  Medication Sig Dispense Refill  . amLODipine (NORVASC) 10 MG tablet TAKE ONE TABLET BY MOUTH ONCE DAILY. 90 tablet 0  . aspirin 325 MG tablet Take 325 mg by mouth once.    Marland Kitchen buPROPion (WELLBUTRIN XL) 150 MG 24 hr tablet TAKE ONE TABLET BY MOUTH ONCE DAILY 90 tablet 1  . Famotidine (PEPCID PO) Take 1 tablet by mouth once.    . Ginkgo Biloba (GINKOBA PO) Take 1 tablet by mouth daily.    Marland Kitchen ibuprofen (ADVIL,MOTRIN) 200 MG tablet Take 400 mg by mouth every 6 (six) hours as needed for moderate pain.    Marland Kitchen loratadine (CLARITIN) 10 MG tablet Take 10 mg by mouth daily as needed for allergies.     . Misc Natural Products (GINSENG COMPLEX PO) Take 1 tablet by mouth daily.     .  Multiple Vitamin (MULTIVITAMIN WITH MINERALS) TABS Take 1 tablet by mouth daily. Reported on 06/12/2015    . omeprazole (PRILOSEC) 20 MG capsule Take 1 capsule (20 mg total) by mouth daily. 30 capsule 0  . tadalafil (CIALIS) 5 MG tablet Take 2.5 mg by mouth daily.    . vitamin B-12 (CYANOCOBALAMIN) 50 MCG tablet Take 50 mcg by mouth daily.    . Vitamin D, Ergocalciferol, (DRISDOL) 50000 units CAPS capsule Take 1 capsule (50,000 Units total) by mouth every 7 (seven) days. 12 capsule 0   No current facility-administered medications on file prior to visit.     BP 140/80 (BP Location: Left Arm, Patient Position: Sitting, Cuff Size: Normal)   Temp 98.6 F (37 C) (Oral)   Ht 5\' 7"  (1.702 m)   Wt 180 lb 4.8 oz (81.8 kg)   BMI 28.24 kg/m       Objective:   Physical Exam  Constitutional: He is oriented to person, place, and time. He appears well-developed and well-nourished. No distress.  HENT:  Head: Normocephalic and atraumatic.  Right Ear: External ear normal.  Left Ear: External ear normal.  Nose: Nose normal.  Mouth/Throat: Oropharynx is clear and moist. No oropharyngeal exudate.  Eyes: Conjunctivae and EOM are normal. Pupils are equal, round, and reactive to light. Right eye exhibits no discharge. Left eye exhibits no discharge.  Neck: Normal range of motion. Neck supple. No thyromegaly present.  Cardiovascular: Normal rate, regular rhythm, normal heart sounds and intact distal pulses.  Exam reveals no gallop.   No murmur heard. Pulmonary/Chest: Effort normal and breath sounds normal. No respiratory distress. He has no wheezes. He has no rales. He exhibits no tenderness.  Musculoskeletal: Normal range of motion. He exhibits tenderness (bilateral trapezius ). He exhibits no edema or deformity.  Lymphadenopathy:    He has no cervical adenopathy.  Neurological: He is alert and oriented to person, place, and time.  Skin: Skin is warm and dry. No rash noted. He is not diaphoretic. No  erythema. No pallor.  Psychiatric: He has a normal mood and affect. His behavior is normal. Judgment and thought content normal.  Nursing note and vitals reviewed.      Assessment & Plan:  1. Acute intractable headache, unspecified headache type - Possible stress related headache or migraine. Do to vision loss I am going to get an MRI.  - Can try taking  Flexeril at night  - cyclobenzaprine (FLEXERIL) 10 MG tablet; Take 1 tablet (10 mg total) by mouth at bedtime.  Dispense: 30 tablet; Refill: 0 - MR Brain Wo Contrast; Future - CBC with Differential/Platelet - Basic Metabolic Panel - ketorolac (TORADOL) injection 60 mg; Inject 2 mLs (60 mg total) into the muscle once. - Follow up with PCP as needed  Dorothyann Peng, NP

## 2016-09-03 ENCOUNTER — Telehealth: Payer: Self-pay | Admitting: Family Medicine

## 2016-09-03 NOTE — Telephone Encounter (Signed)
Results are being faxed over from [facility]. I notified patient that we are awaiting results and we will contact patient as soon as results are in. Patient verbalized understanding.

## 2016-09-03 NOTE — Telephone Encounter (Signed)
Pt would like results of MRI. Saw Cory for this issue.

## 2016-09-04 NOTE — Telephone Encounter (Signed)
Please advise once results are reviewed. Thanks!

## 2016-09-04 NOTE — Telephone Encounter (Signed)
Pt calling to check the status of his MRI that was done on Sunday at [facility]

## 2016-09-15 ENCOUNTER — Other Ambulatory Visit: Payer: Self-pay | Admitting: Adult Health

## 2016-09-15 DIAGNOSIS — R51 Headache: Principal | ICD-10-CM

## 2016-09-15 DIAGNOSIS — R519 Headache, unspecified: Secondary | ICD-10-CM

## 2016-09-21 ENCOUNTER — Other Ambulatory Visit: Payer: Self-pay | Admitting: Family Medicine

## 2016-09-30 ENCOUNTER — Encounter: Payer: Self-pay | Admitting: Family Medicine

## 2016-09-30 ENCOUNTER — Ambulatory Visit (INDEPENDENT_AMBULATORY_CARE_PROVIDER_SITE_OTHER): Payer: BC Managed Care – PPO | Admitting: Family Medicine

## 2016-09-30 VITALS — BP 120/80 | HR 84 | Temp 98.7°F | Ht 67.0 in | Wt 182.6 lb

## 2016-09-30 DIAGNOSIS — Z0001 Encounter for general adult medical examination with abnormal findings: Secondary | ICD-10-CM | POA: Diagnosis not present

## 2016-09-30 DIAGNOSIS — R51 Headache: Secondary | ICD-10-CM | POA: Diagnosis not present

## 2016-09-30 DIAGNOSIS — Z0000 Encounter for general adult medical examination without abnormal findings: Secondary | ICD-10-CM

## 2016-09-30 LAB — HEPATIC FUNCTION PANEL
ALT: 25 U/L (ref 0–53)
AST: 18 U/L (ref 0–37)
Albumin: 4.8 g/dL (ref 3.5–5.2)
Alkaline Phosphatase: 69 U/L (ref 39–117)
Bilirubin, Direct: 0.1 mg/dL (ref 0.0–0.3)
Total Bilirubin: 0.7 mg/dL (ref 0.2–1.2)
Total Protein: 6.8 g/dL (ref 6.0–8.3)

## 2016-09-30 LAB — LIPID PANEL
Cholesterol: 186 mg/dL (ref 0–200)
HDL: 57.8 mg/dL (ref >39.00)
LDL Cholesterol: 111 mg/dL — ABNORMAL HIGH (ref 0–99)
NonHDL: 128.32
Total CHOL/HDL Ratio: 3
Triglycerides: 85 mg/dL (ref 0.0–149.0)
VLDL: 17 mg/dL (ref 0.0–40.0)

## 2016-09-30 LAB — TSH: TSH: 1.4 u[IU]/mL (ref 0.35–4.50)

## 2016-09-30 LAB — PSA: PSA: 0.92 ng/mL (ref 0.10–4.00)

## 2016-09-30 MED ORDER — NORTRIPTYLINE HCL 10 MG PO CAPS: 10.0000 mg | ORAL_CAPSULE | Freq: Every day | ORAL | 1 refills | Status: DC

## 2016-09-30 NOTE — Patient Instructions (Signed)
Set up repeat colonoscopy Start the [place] one at night and may increase to two at night after one week.if no better.

## 2016-09-30 NOTE — Progress Notes (Signed)
Subjective:     Patient ID: Donald Clark, male   DOB: Jul 24, 1960, 56 y.o.   MRN: [id number]  HPI   Patient seen for physical exam. He had recent onset of daily dull bilateral headache. 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 Also comment of "supratentorial area demonstrating mild degree of lateral ventricular system dilation". No evidence for hemorrhage. No evidence for mass. There was mention of focus of increased signal intensity left occipital white matter contiguous with posterior lateral ventricular system consistent with focal focus of ischemia.  Patient's headache is no better and no worse. Nonexertional. No speech changes. He has had some occasional decreased vision right eye. Taking almost daily ibuprofen. He takes daily aspirin. He has hypertension which is stable with amlodipine has been on this for many years.  Patient's parents both had colon cancer. His last colonoscopy was little over 5 years ago.  Past Medical History:  Diagnosis Date  . Allergy   . Cancer ([place])    skin- multiple removed-1 basal cell, 1 squamous cell  . Chicken pox   . Chronic kidney disease    stones  . Depression   . GERD (gastroesophageal reflux disease)   . Hypertension   . Prostatitis    Past Surgical History:  Procedure Laterality Date  . COLONOSCOPY    . LITHOTRIPSY    . skin cancer removed     on ankle, back neck  . UPPER GASTROINTESTINAL ENDOSCOPY      reports that he quit smoking about 35 years ago. His smoking use included Cigarettes. He has a 2.00 pack-year smoking history. He has never used smokeless tobacco. He reports that he drinks alcohol. He reports that he does not use drugs. family history includes Cancer in his mother; Cancer (age of onset: 26) in his father; Colon cancer in his mother; Diabetes in his maternal grandfather and paternal grandmother; Hypertension in his mother; Rectal cancer in his father. Allergies   Allergen Reactions  . Penicillins     GI upset Has patient had a PCN reaction causing immediate rash, facial/tongue/throat swelling, SOB or lightheadedness with hypotension: No Has patient had a PCN reaction causing severe rash involving mucus membranes or skin necrosis: No Has patient had a PCN reaction that required hospitalization no Has patient had a PCN reaction occurring within the last 10 years: Yes If all of the above answers are "NO", then may proceed with Cephalosporin use.   . Sulfa Antibiotics     Hives, joint pain, rash     Review of Systems  Constitutional: Negative for activity change, appetite change, fatigue and fever.  HENT: Negative for congestion, ear pain and trouble swallowing.   Eyes: Negative for pain.  Respiratory: Negative for cough, shortness of breath and wheezing.   Cardiovascular: Negative for chest pain and palpitations.  Gastrointestinal: Negative for abdominal distention, abdominal pain, blood in stool, constipation, diarrhea, nausea, rectal pain and vomiting.  Genitourinary: Negative for dysuria, hematuria and testicular pain.  Musculoskeletal: Negative for arthralgias and joint swelling.  Skin: Negative for rash.  Neurological: Positive for headaches. Negative for dizziness and syncope.  Hematological: Negative for adenopathy.  Psychiatric/Behavioral: Negative for confusion and dysphoric mood.       Objective:   Physical Exam  Constitutional: He is oriented to person, place, and time. He appears well-developed and well-nourished. No distress.  HENT:  Head: Normocephalic and atraumatic.  Right Ear: External ear normal.  Left Ear: External  ear normal.  Mouth/Throat: Oropharynx is clear and moist.  Eyes: Pupils are equal, round, and reactive to light. Conjunctivae and EOM are normal.  Neck: Normal range of motion. Neck supple. No thyromegaly present.  Cardiovascular: Normal rate, regular rhythm and normal heart sounds.   No murmur  heard. Pulmonary/Chest: No respiratory distress. He has no wheezes. He has no rales.  Abdominal: Soft. Bowel sounds are normal. He exhibits no distension and no mass. There is no tenderness. There is no rebound and no guarding.  Genitourinary: Rectum normal and prostate normal.  Musculoskeletal: He exhibits no edema.  Lymphadenopathy:    He has no cervical adenopathy.  Neurological: He is alert and oriented to person, place, and time. He displays normal reflexes. No cranial nerve deficit.  Skin: No rash noted.  Psychiatric: He has a normal mood and affect.       Assessment:     Physical exam. Positive family of colon cancer both parents. Due for repeat colonoscopy. Recent CBC and basic metabolic panel unremarkable  Patient relates daily dull bilateral headache. Question daily chronic tension-type headache    Plan:     -Set up repeat colonoscopy -Obtain labs with lipid panel, TSH, hepatic panel, PSA. -Other recent labs as above unremarkable -Consider trial of nortriptyline 10 mg daily at bedtime -Consider consultation with neurologist regarding recent abnormal MRI results  Eulas Post MD [facility]

## 2016-10-20 ENCOUNTER — Other Ambulatory Visit: Payer: Self-pay | Admitting: Adult Health

## 2016-10-20 DIAGNOSIS — R51 Headache: Principal | ICD-10-CM

## 2016-10-20 DIAGNOSIS — R519 Headache, unspecified: Secondary | ICD-10-CM

## 2016-10-21 NOTE — Telephone Encounter (Signed)
Refill OK

## 2016-10-21 NOTE — Telephone Encounter (Signed)
Last refill 09/15/16 and last office visit 09/30/16.  Okay to fill?

## 2016-12-17 ENCOUNTER — Telehealth: Payer: Self-pay | Admitting: Family Medicine

## 2016-12-17 DIAGNOSIS — Z87898 Personal history of other specified conditions: Secondary | ICD-10-CM

## 2016-12-17 NOTE — Telephone Encounter (Signed)
Pt is calling stating that he is still waiting on the referral to the neurologist for his headaches the he and Dr. Elease Hashimoto had discussed during his CPE in August.  He state that this office is very bad about communicating with him.

## 2016-12-18 NOTE — Telephone Encounter (Signed)
Set up referral to [facility] for further evaluation

## 2016-12-18 NOTE — Telephone Encounter (Signed)
Referral placed.

## 2016-12-27 ENCOUNTER — Other Ambulatory Visit: Payer: Self-pay | Admitting: Family Medicine

## 2017-01-14 ENCOUNTER — Other Ambulatory Visit: Payer: Self-pay | Admitting: Family Medicine

## 2017-01-14 DIAGNOSIS — R519 Headache, unspecified: Secondary | ICD-10-CM

## 2017-01-14 DIAGNOSIS — R51 Headache: Principal | ICD-10-CM

## 2017-01-15 NOTE — Telephone Encounter (Signed)
Refill OK

## 2017-02-02 ENCOUNTER — Other Ambulatory Visit: Payer: Self-pay | Admitting: *Deleted

## 2017-02-02 MED ORDER — BUPROPION HCL ER (XL) 150 MG PO TB24: 150.0000 mg | ORAL_TABLET | Freq: Every day | ORAL | 1 refills | Status: DC

## 2017-03-02 HISTORY — PX: COLONOSCOPY: SHX174

## 2017-03-22 ENCOUNTER — Other Ambulatory Visit: Payer: Self-pay | Admitting: Urology

## 2017-03-25 ENCOUNTER — Encounter (HOSPITAL_COMMUNITY): Payer: Self-pay | Admitting: *Deleted

## 2017-03-29 ENCOUNTER — Ambulatory Visit (HOSPITAL_COMMUNITY)
Admission: RE | Admit: 2017-03-29 | Discharge: 2017-03-29 | Disposition: A | Discharge status: Home / Self Care | Payer: BC Managed Care – PPO | Source: Ambulatory Visit | Attending: Urology | Admitting: Urology

## 2017-03-29 ENCOUNTER — Encounter (HOSPITAL_COMMUNITY)
Admission: RE | Disposition: A | Discharge status: Home / Self Care | Payer: Self-pay | Source: Ambulatory Visit | Attending: Urology

## 2017-03-29 ENCOUNTER — Encounter (HOSPITAL_COMMUNITY): Payer: Self-pay | Admitting: General Practice

## 2017-03-29 ENCOUNTER — Ambulatory Visit (HOSPITAL_COMMUNITY): Payer: BC Managed Care – PPO

## 2017-03-29 DIAGNOSIS — R109 Unspecified abdominal pain: Secondary | ICD-10-CM | POA: Diagnosis present

## 2017-03-29 DIAGNOSIS — Z882 Allergy status to sulfonamides status: Secondary | ICD-10-CM | POA: Diagnosis not present

## 2017-03-29 DIAGNOSIS — Z79899 Other long term (current) drug therapy: Secondary | ICD-10-CM | POA: Diagnosis not present

## 2017-03-29 DIAGNOSIS — Z87891 Personal history of nicotine dependence: Secondary | ICD-10-CM | POA: Insufficient documentation

## 2017-03-29 DIAGNOSIS — N400 Enlarged prostate without lower urinary tract symptoms: Secondary | ICD-10-CM | POA: Diagnosis not present

## 2017-03-29 DIAGNOSIS — Z87442 Personal history of urinary calculi: Secondary | ICD-10-CM | POA: Insufficient documentation

## 2017-03-29 DIAGNOSIS — I10 Essential (primary) hypertension: Secondary | ICD-10-CM | POA: Insufficient documentation

## 2017-03-29 DIAGNOSIS — Z880 Allergy status to penicillin: Secondary | ICD-10-CM | POA: Diagnosis not present

## 2017-03-29 DIAGNOSIS — N201 Calculus of ureter: Secondary | ICD-10-CM

## 2017-03-29 DIAGNOSIS — N132 Hydronephrosis with renal and ureteral calculous obstruction: Secondary | ICD-10-CM | POA: Diagnosis not present

## 2017-03-29 HISTORY — PX: EXTRACORPOREAL SHOCK WAVE LITHOTRIPSY: SHX1557

## 2017-03-29 SURGERY — LITHOTRIPSY, ESWL
Anesthesia: LOCAL | Laterality: Right

## 2017-03-29 MED ORDER — DIAZEPAM 5 MG PO TABS
10.0000 mg | ORAL_TABLET | ORAL | Status: AC
  Administered 2017-03-29: 10 mg via ORAL
  Filled 2017-03-29: qty 2

## 2017-03-29 MED ORDER — SODIUM CHLORIDE 0.9 % IV SOLN
INTRAVENOUS | Status: DC
  Administered 2017-03-29: 07:00:00 via INTRAVENOUS

## 2017-03-29 MED ORDER — CIPROFLOXACIN HCL 500 MG PO TABS
500.0000 mg | ORAL_TABLET | ORAL | Status: AC
  Administered 2017-03-29: 500 mg via ORAL
  Filled 2017-03-29: qty 1

## 2017-03-29 MED ORDER — DIPHENHYDRAMINE HCL 25 MG PO CAPS
25.0000 mg | ORAL_CAPSULE | ORAL | Status: AC
  Administered 2017-03-29: 25 mg via ORAL
  Filled 2017-03-29: qty 1

## 2017-03-29 NOTE — H&P (Signed)
CC: I have pain in the flank.  HPI: Donald Clark is a 57 year-old male established patient who is here for flank pain.  Has had nausea and temp last pm 100 but today has been afebrile. Has treated pain with PRN Tylenol and Ibuprofen. Was scheduled for ESWL in 2017 but was able to pass stone prior to procedure and has been pain free since.    The problem is on the right side. His pain started about approximately 03/10/2017. The pain is sharp. The intensity of his pain is rated as a 3. The pain is constant. The pain does not radiate.   Pain medications< makes the pain better. Twisting makes the pain worse. He was treated with the following pain medication(s): Tylenol.   He has had this same pain previously. He has had kidney stones.     AUA Symptom Score: Less than 20% of the time he has the sensation of not emptying his bladder completely when finished urinating. He never has to urinate again less that two hours after he has finished urinating. He does not have to stop and start again several times when he urinates. He never finds it difficult to postpone urination. Less than 20% of the time he has a weak urinary stream. He never has to push or strain to begin urination. He has to get up to urinate 2 times from the time he goes to bed until the time he gets up in the morning.   Calculated AUA Symptom Score: 4    ALLERGIES: Penicillins - Nausea Sulfa Drugs - Skin Rash, Hives    MEDICATIONS: Amlodipine Besylate 10 mg tablet Oral  Claritin TABS Oral  Qudexy Xr  Wellbutrin Xl 150 mg tablet, extended release 24 hr     GU PSH: ESWL - 2014, 2012, 2012, 2012    NON-GU PSH: None   GU PMH: Flank Pain (Acute), Right, Ketorolac 60 mg IM Hydrocodone 7.5/300 mg 1-2 po Q6 hrs prn - 11/21/2015 Hydronephrosis Unspec (Acute), Right, Secondary to proximal right ureteral stone - 11/21/2015, Hydronephrosis, right, - 2014, Hydronephrosis, bilateral, - 2014 Ureteral calculus (Acute), Right, Recommend  proceeding with ESWL Tamsulosin 0.4 mg 1 po daily - 11/21/2015, Proximal Ureteral Stone On The Right, - 2014 Renal calculus - 09/13/2015, Nephrolithiasis, - 2016, Bilateral kidney stones, - 2015 BPH w/LUTS, Benign prostatic hyperplasia with urinary obstruction - 2016 ED due to arterial insufficiency, Erectile dysfunction due to arterial insufficiency - 2016 Spermatocele of epididymis, Unspec, Spermatocele - 2016 Testicular pain, unspecified, Testicular pain - 2016 Weak Urinary Stream, Weak urinary stream - 2016 Gross hematuria, Gross hematuria - 2014 History of urolithiasis, Nephrolithiasis - 2014 Other microscopic hematuria, Microscopic hematuria - 2014    NON-GU PMH: Encounter for general adult medical examination without abnormal findings, Encounter for preventive health examination - 2015 Personal history of other diseases of the circulatory system, History of hypertension - 2014 Personal history of other diseases of the digestive system, History of esophageal reflux - 2014 Personal history of other mental and behavioral disorders, History of depression - 2014    FAMILY HISTORY: Colon Cancer - Runs In Family Death In The Family Father - Father Death In The Family Mother - Father Family Health Status Number - Father   SOCIAL HISTORY: Marital Status: Married Preferred Language: English; Ethnicity: Not Hispanic Or Latino; Race: White Current Smoking Status: Patient does not smoke anymore. Has not smoked since 08/30/1980.  Does not use smokeless tobacco. Social Drinker.  Does not use drugs. Drinks 3 caffeinated  drinks per day. 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    REVIEW OF SYSTEMS:    GU Review Male:   Patient reports get up at night to urinate. Patient denies frequent urination, hard to postpone urination, burning/ pain with urination, leakage of urine, stream starts and stops, trouble starting your stream, have to strain to urinate , erection  problems, and penile pain.  Gastrointestinal (Upper):   Patient reports nausea and indigestion/ heartburn. Patient denies vomiting.  Gastrointestinal (Lower):   Patient denies diarrhea and constipation.  Constitutional:   Patient reports fever. Patient denies night sweats, weight loss, and fatigue.  Skin:   Patient denies skin rash/ lesion and itching.  Eyes:   Patient reports blurred vision. Patient denies double vision.  Ears/ Nose/ Throat:   Patient denies sore throat and sinus problems.  Hematologic/Lymphatic:   Patient denies swollen glands and easy bruising.  Cardiovascular:   Patient denies leg swelling and chest pains.  Respiratory:   Patient denies cough and shortness of breath.  Endocrine:   Patient denies excessive thirst.  Musculoskeletal:   Patient denies back pain and joint pain.  Neurological:   Patient denies headaches and dizziness.  Psychologic:   Patient denies depression and anxiety.   VITAL SIGNS:      03/17/2017 01:34 PM  Weight 176 lb / 79.83 kg  Height 67 in / 170.18 cm  BP 137/88 mmHg  Pulse 95 /min  Temperature 97.1 F / 36.1 C  BMI 27.6 kg/m   MULTI-SYSTEM PHYSICAL EXAMINATION:    Constitutional: Well-nourished. No physical deformities. Normally developed. Good grooming.   Respiratory: No labored breathing, no use of accessory muscles.   Cardiovascular: Normal temperature, normal extremity pulses, no swelling, no varicosities.   Skin: No paleness, no jaundice, no cyanosis. No lesion, no ulcer, no rash.   Neurologic / Psychiatric: Oriented to time, oriented to place, oriented to person. No depression, no anxiety, no agitation.   Gastrointestinal: No mass, no tenderness, no rigidity, non obese abdomen. (+) R CVAT  Musculoskeletal: Normal gait and station of head and neck.     PAST DATA REVIEWED:  Source Of History:  Patient  Records Review:   Previous Patient Records  Urine Test Review:   Urinalysis   PROCEDURES:         Renal Ultrasound (Limited) -  40981  RT Kidney: Length: 11.3 cm Depth:6.4 cm Cortical Width:1.1 cm Width: 6.4 cm    Right Kidney/Ureter:  Hydro noted, ? Stone in [employer] 0.94cm. Prox ureter= 1.2cm.  Bladder:  PVR= 5.29. ? Stone in bladder vs calc in prostate= 0.91cm               KUB - [id number]  A single view of the abdomen is obtained.      Stable left lower pole stone. Right UPJ stone appears unchanged from previous KUB 11/2015. No bilateral ureteral calcifications noted.          Urinalysis w/Scope - 81001 Dipstick Dipstick Cont'd Micro  Specimen: Voided Bilirubin: Neg WBC/hpf: 0 - 5/hpf  Color: Yellow Ketones: Neg RBC/hpf: 0 - 2/hpf  Appearance: Clear Blood: 1+ Bacteria: Rare (0-9/hpf)  Specific Gravity: 1.015 Protein: Trace Cystals: Amorph Urates  pH: 6.0 Urobilinogen: 0.2 Casts: NS (Not Seen)  Glucose: Neg Nitrites: Neg Trichomonas: Not Present    Leukocyte Esterase: Neg Mucous: Not Present      Epithelial Cells: NS (Not Seen)      Yeast: NS (Not Seen)      Sperm: Not  Present         Ketoralac 60mg  - [id number], [id number] Qty: 60 Adm. By: Geoffery Spruce  Unit: mg Lot No [id number]  Route: IM Exp. Date 05/01/2018  Freq: None Mfgr.:   Site: Right Buttock   ASSESSMENT:      ICD-10 Details  1 GU:   Flank Pain - R10.84 Right, Acute - Ketorolac 60 mg IM today. Culture urine. Empirically begin Doxycycline 100 mg 1 po BID X 7 days till culture complete. Secondary to (R) UPJ calculus.   2   Ureteral calculus - N20.1 Right, Acute - Proceed with ESWL. Hydrocodone 5/325 mg 1 po Q6 hrs prn. Tamsulosin 0.4 mg 1 po daily. Instructed to contact office if he has any acute changes ie temp >100.5, vomiting, intractable pain, or chills.   3   Hydronephrosis Unspec - N13.30 Right, Acute - Secondary to (R) UPJ calculus   PLAN:            Medications New Meds: Doxycycline Hyclate 100 mg tablet, delayed release 1 tablet PO BID   #14  0 Refill(s)  Tamsulosin Hcl 0.4 mg capsule 1 capsule PO Q HS   #30  0 Refill(s)   Hydrocodone-Acetaminophen 5 mg-325 mg tablet 1 tablet PO Q 6 H PRN   #20  0 Refill(s)            Orders Labs Urine Culture  X-Rays: Renal Ultrasound (Limited)    KUB          Schedule Procedure: 03/17/2017 at [facility] - [phone] - Ketoralac 60mg  (Toradol Per 15 Mg) - [id number], 35361          Document Letter(s):  Created for Patient: Clinical Summary         Notes:   ESWL for right UPJ stone

## 2017-03-30 ENCOUNTER — Encounter (HOSPITAL_COMMUNITY): Payer: Self-pay | Admitting: Urology

## 2017-06-30 ENCOUNTER — Other Ambulatory Visit: Payer: Self-pay | Admitting: Family Medicine

## 2017-08-09 ENCOUNTER — Other Ambulatory Visit: Payer: Self-pay | Admitting: Family Medicine

## 2017-09-27 ENCOUNTER — Other Ambulatory Visit: Payer: Self-pay | Admitting: Family Medicine

## 2017-11-10 ENCOUNTER — Other Ambulatory Visit: Payer: Self-pay | Admitting: Family Medicine

## 2017-12-23 ENCOUNTER — Other Ambulatory Visit: Payer: Self-pay | Admitting: Family Medicine

## 2017-12-27 ENCOUNTER — Other Ambulatory Visit: Payer: Self-pay | Admitting: Family Medicine

## 2018-02-09 ENCOUNTER — Other Ambulatory Visit: Payer: Self-pay | Admitting: Family Medicine

## 2018-02-25 ENCOUNTER — Other Ambulatory Visit: Payer: Self-pay | Admitting: Family Medicine

## 2018-02-25 NOTE — Telephone Encounter (Signed)
Copied from [place] [id number]. Topic: Quick Communication - Rx Refill/Question >> Feb 25, 2018  2:26 PM Sallee Provencal, [place] B, NT wrote: Medication: buPROPion (WELLBUTRIN XL) 150 MG 24 hr tablet  Has the patient contacted their pharmacy? Yes.   (Agent: If no, request that the patient contact the pharmacy for the refill.) (Agent: If yes, when and what did the pharmacy advise?)  Preferred Pharmacy (with phone number or street name): [id number] - [place], [place]. AT [address]  Agent: Please be advised that RX refills may take up to 3 business days. We ask that you follow-up with your pharmacy.

## 2018-02-28 MED ORDER — BUPROPION HCL ER (XL) 150 MG PO TB24: 150.0000 mg | ORAL_TABLET | Freq: Every day | ORAL | 0 refills | Status: DC

## 2018-03-01 ENCOUNTER — Other Ambulatory Visit: Payer: Self-pay

## 2018-03-01 ENCOUNTER — Ambulatory Visit (INDEPENDENT_AMBULATORY_CARE_PROVIDER_SITE_OTHER): Payer: BC Managed Care – PPO | Admitting: Family Medicine

## 2018-03-01 ENCOUNTER — Encounter: Payer: Self-pay | Admitting: Family Medicine

## 2018-03-01 VITALS — BP 132/82 | HR 75 | Temp 97.8°F | Ht 65.0 in | Wt 167.0 lb

## 2018-03-01 DIAGNOSIS — Z0000 Encounter for general adult medical examination without abnormal findings: Secondary | ICD-10-CM | POA: Diagnosis not present

## 2018-03-01 DIAGNOSIS — Z23 Encounter for immunization: Secondary | ICD-10-CM | POA: Diagnosis not present

## 2018-03-01 LAB — CBC WITH DIFFERENTIAL/PLATELET
Basophils Absolute: 0.1 10*3/uL (ref 0.0–0.1)
Basophils Relative: 1.1 % (ref 0.0–3.0)
Eosinophils Absolute: 0.2 10*3/uL (ref 0.0–0.7)
Eosinophils Relative: 3.7 % (ref 0.0–5.0)
HCT: 45.9 % (ref 39.0–52.0)
Hemoglobin: 15.8 g/dL (ref 13.0–17.0)
Lymphocytes Relative: 17.6 % (ref 12.0–46.0)
Lymphs Abs: 1 10*3/uL (ref 0.7–4.0)
MCHC: 34.4 g/dL (ref 30.0–36.0)
MCV: 84.1 fl (ref 78.0–100.0)
Monocytes Absolute: 0.4 10*3/uL (ref 0.1–1.0)
Monocytes Relative: 6.5 % (ref 3.0–12.0)
Neutro Abs: 4.1 10*3/uL (ref 1.4–7.7)
Neutrophils Relative %: 71.1 % (ref 43.0–77.0)
Platelets: 235 10*3/uL (ref 150.0–400.0)
RBC: 5.45 Mil/uL (ref 4.22–5.81)
RDW: 13.3 % (ref 11.5–15.5)
WBC: 5.8 10*3/uL (ref 4.0–10.5)

## 2018-03-01 LAB — HEPATIC FUNCTION PANEL
ALT: 19 U/L (ref 0–53)
AST: 17 U/L (ref 0–37)
Albumin: 4.6 g/dL (ref 3.5–5.2)
Alkaline Phosphatase: 80 U/L (ref 39–117)
Bilirubin, Direct: 0.1 mg/dL (ref 0.0–0.3)
Total Bilirubin: 0.4 mg/dL (ref 0.2–1.2)
Total Protein: 6.5 g/dL (ref 6.0–8.3)

## 2018-03-01 LAB — BASIC METABOLIC PANEL
BUN: 15 mg/dL (ref 6–23)
CO2: 29 mEq/L (ref 19–32)
Calcium: 9.4 mg/dL (ref 8.4–10.5)
Chloride: 105 mEq/L (ref 96–112)
Creatinine, Ser: 0.91 mg/dL (ref 0.40–1.50)
GFR: 90.9 mL/min (ref >60.00)
Glucose, Bld: 84 mg/dL (ref 70–99)
Potassium: 4.6 mEq/L (ref 3.5–5.1)
Sodium: 141 mEq/L (ref 135–145)

## 2018-03-01 LAB — LIPID PANEL
Cholesterol: 166 mg/dL (ref 0–200)
HDL: 55.6 mg/dL (ref >39.00)
LDL Cholesterol: 89 mg/dL (ref 0–99)
NonHDL: 110.35
Total CHOL/HDL Ratio: 3
Triglycerides: 107 mg/dL (ref 0.0–149.0)
VLDL: 21.4 mg/dL (ref 0.0–40.0)

## 2018-03-01 LAB — TSH: TSH: 1.63 u[IU]/mL (ref 0.35–4.50)

## 2018-03-01 LAB — PSA: PSA: 1.02 ng/mL (ref 0.10–4.00)

## 2018-03-01 MED ORDER — BUPROPION HCL ER (XL) 300 MG PO TB24: 300.0000 mg | ORAL_TABLET | Freq: Every day | ORAL | 3 refills | Status: DC

## 2018-03-01 NOTE — Progress Notes (Signed)
Subjective:     Patient ID: Donald Clark, male   DOB: 27-Sep-1960, 57 y.o.   MRN: [id number]  HPI Patient seen for physical exam.  He has history of recurrent depression and feels he has had some recent worsening symptoms.  He takes Wellbutrin XL 150 mg daily.  Previous intolerance with multiple SSRIs.  He and wife have recent stressor of taking on care of 2 young children which are his niece and nephew.  They may be looking at adoption.  Patient has strong family history of colon cancer in both parents.  He needs follow-up colonoscopy.  Last was over 5 years ago.  No history of polyps.  No history of hepatitis C screening.  Tetanus up-to-date.  Still needs flu vaccine.  Past Medical History:  Diagnosis Date  . Allergy   . Cancer ([place])    skin- multiple removed-1 basal cell, 1 squamous cell  . Chicken pox   . Chronic kidney disease    stones  . Depression   . GERD (gastroesophageal reflux disease)   . Hypertension   . Prostatitis    Past Surgical History:  Procedure Laterality Date  . COLONOSCOPY    . EXTRACORPOREAL SHOCK WAVE LITHOTRIPSY Right 03/29/2017   Procedure: RIGHT EXTRACORPOREAL SHOCK WAVE LITHOTRIPSY (ESWL);  Surgeon: Nickie Retort, MD;  Location: [facility];  Service: Urology;  Laterality: Right;  . LITHOTRIPSY    . skin cancer removed     on ankle, back neck  . UPPER GASTROINTESTINAL ENDOSCOPY      reports that he quit smoking about 37 years ago. His smoking use included cigarettes. He has a 2.00 pack-year smoking history. He has never used smokeless tobacco. He reports current alcohol use. He reports that he does not use drugs. family history includes Cancer in his mother; Cancer (age of onset: 68) in his father; Colon cancer in his mother; Diabetes in his maternal grandfather and paternal grandmother; Hypertension in his mother; Rectal cancer in his father. Allergies  Allergen Reactions  . Penicillins     GI upset Has patient had a PCN reaction causing  immediate rash, facial/tongue/throat swelling, SOB or lightheadedness with hypotension: No Has patient had a PCN reaction causing severe rash involving mucus membranes or skin necrosis: No Has patient had a PCN reaction that required hospitalization no Has patient had a PCN reaction occurring within the last 10 years: Yes If all of the above answers are "NO", then may proceed with Cephalosporin use.   . Sulfa Antibiotics     Hives, joint pain, rash     Review of Systems  Constitutional: Negative for activity change, appetite change, fatigue and fever.  HENT: Negative for congestion, ear pain and trouble swallowing.   Eyes: Negative for pain and visual disturbance.  Respiratory: Negative for cough, chest tightness, shortness of breath and wheezing.   Cardiovascular: Negative for chest pain, palpitations and leg swelling.  Gastrointestinal: Negative for abdominal distention, abdominal pain, blood in stool, constipation, diarrhea, nausea, rectal pain and vomiting.  Genitourinary: Negative for dysuria, hematuria and testicular pain.  Musculoskeletal: Negative for arthralgias and joint swelling.  Skin: Negative for rash.  Neurological: Negative for dizziness, syncope, weakness, light-headedness and headaches.  Hematological: Negative for adenopathy.  Psychiatric/Behavioral: Positive for dysphoric mood. Negative for confusion and suicidal ideas.       Objective:   Physical Exam Constitutional:      General: He is not in acute distress.    Appearance: He is well-developed.  HENT:  Head: Normocephalic and atraumatic.     Right Ear: External ear normal.     Left Ear: External ear normal.  Eyes:     Conjunctiva/sclera: Conjunctivae normal.     Pupils: Pupils are equal, round, and reactive to light.  Neck:     Musculoskeletal: Normal range of motion and neck supple.     Thyroid: No thyromegaly.  Cardiovascular:     Rate and Rhythm: Normal rate and regular rhythm.     Heart sounds:  Normal heart sounds. No murmur.  Pulmonary:     Effort: No respiratory distress.     Breath sounds: No wheezing or rales.  Abdominal:     General: Bowel sounds are normal. There is no distension.     Palpations: Abdomen is soft. There is no mass.     Tenderness: There is no abdominal tenderness. There is no guarding or rebound.  Lymphadenopathy:     Cervical: No cervical adenopathy.  Skin:    Findings: No rash.  Neurological:     Mental Status: He is alert and oriented to person, place, and time.     Cranial Nerves: No cranial nerve deficit.     Deep Tendon Reflexes: Reflexes normal.  Psychiatric:     Comments: PHQ-9 score of 15        Assessment:     Physical exam.  Patient has history of recurrent depression currently at least moderate range.    Plan:     -Increase Wellbutrin XL to 300 mg once daily -Offered counseling with our behavioral health division -Set up referral back to GI for repeat colonoscopy -Obtain lab work including hepatitis C antibody. -Touch base if not seeing significant improvements next 2 to 3 weeks with increased dose of Wellbutrin.  Eulas Post MD [facility] [place]

## 2018-03-03 LAB — HEPATITIS C ANTIBODY
Hepatitis C Ab: NONREACTIVE
SIGNAL TO CUT-OFF: 0.01 (ref <1.00)

## 2018-03-26 ENCOUNTER — Other Ambulatory Visit: Payer: Self-pay | Admitting: Family Medicine

## 2018-05-06 ENCOUNTER — Encounter: Payer: Self-pay | Admitting: Gastroenterology

## 2018-05-19 ENCOUNTER — Other Ambulatory Visit: Payer: Self-pay | Admitting: *Deleted

## 2018-05-19 ENCOUNTER — Telehealth: Payer: Self-pay | Admitting: Family Medicine

## 2018-05-19 ENCOUNTER — Ambulatory Visit: Payer: Self-pay

## 2018-05-19 ENCOUNTER — Ambulatory Visit: Payer: BC Managed Care – PPO | Admitting: Internal Medicine

## 2018-05-19 DIAGNOSIS — R6889 Other general symptoms and signs: Secondary | ICD-10-CM

## 2018-05-19 NOTE — Telephone Encounter (Signed)
Pt sent to COVID testing tent.

## 2018-05-19 NOTE — Telephone Encounter (Signed)
Patient called in with c/o "cough, fever." He says "I went to a conference in [place] on 05/09/18 with people from all over the [place], some from [place] and all over he states. We were there for 2 days, I stayed in the hotel, and we worked in groups at [employer]. On Thursday, I developed a fever and a dry cough. The fever ranged from 99-101 off and on since then. I've been taking OTC cough suppressant and expectorants to help, but the cough is not getting better." I asked about SOB, he says "I wouldn't say SOB, but I don't have the lung capacity I should have. I have hard time taking in good deep breaths." I asked about exposure to a positive COVID-19 person, he says "I don't think so." I asked about travel on a plan in the last 2 weeks or out of the country, he denies. I asked about other symptoms, he says "I have pain in the lower third of my chest when I take a deep breath, on both sides." According to protocol, see PCP within 24 hours. I called the office to verify if the patient needed testing for COVID-19 due to he says he was with some people from [place], I spoke to [place], [place] who says the patient can be scheduled today with either Dr. Jerilee Hoh or Tommi Rumps and decisions will be made from there. I advised the patient, appointment scheduled for today at 1400 with Dr. Jerilee Hoh, care advice given, patient verbalized understanding.  Reason for Disposition . Fever present > 3 days (72 hours)  Answer Assessment - Initial Assessment Questions 1. PLACE of CONTACT: "Where were you when you were exposed to COVID-19  (coronavirus disease 2019)?" (e.g., city, state, country)     [place] at a conference with people from all over [place]; 60 people on 05/09/18  2. TYPE of CONTACT: "How much contact was there?" (e.g., live in same house, work in same office, same school)     Worked in groups at tables, close contact 3. DATE of CONTACT: "When did you have contact with a coronavirus patient?" (e.g., days)     N/A 4. DURATION of  CONTACT: "How long were you in contact with the COVID-19 (coronavirus disease) patient?" (e.g., a few seconds, passed by person, a few minutes, live with the patient)     2 days at the conference, stayed in the hotel 5. SYMPTOMS: "Do you have any symptoms?" (e.g., fever, cough, breathing difficulty)     Fever (99-101), cough, pain in lower third chest when breathe deeply 6. PREGNANCY OR POSTPARTUM: "Is there any chance you are pregnant?" "When was your last menstrual period?" "Did you deliver in the last 2 weeks?"     N/A 7. HIGH RISK: "Do you have any heart or lung problems? Do you have a weakened immune system?" (e.g., CHF, COPD, asthma, HIV positive, chemotherapy, renal failure, diabetes mellitus, sickle cell anemia)     No  Answer Assessment - Initial Assessment Questions 1. ONSET: "When did the cough begin?"      Last Thursday or Friday 2. SEVERITY: "How bad is the cough today?"      Not that bad, it just comes 3. RESPIRATORY DISTRESS: "Describe your breathing."      I don't feel SOB, but don't feel I have the right lung capacity, not getting a good deep breath 4. FEVER: "Do you have a fever?" If so, ask: "What is your temperature, how was it measured, and when did it start?"  99-101 off and on since Thursday 5. HEMOPTYSIS: "Are you coughing up any blood?" If so ask: "How much?" (flecks, streaks, tablespoons, etc.)     No 6. TREATMENT: "What have you done so far to treat the cough?" (e.g., meds, fluids, humidifier)     OTC cough suppressant and expectorant 7. CARDIAC HISTORY: "Do you have any history of heart disease?" (e.g., heart attack, congestive heart failure)      No 8. LUNG HISTORY: "Do you have any history of lung disease?"  (e.g., pulmonary embolus, asthma, emphysema)     No 9. PE RISK FACTORS: "Do you have a history of blood clots?" (or: recent major surgery, recent prolonged travel, bedridden)     No 10. OTHER SYMPTOMS: "Do you have any other symptoms? (e.g., runny  nose, wheezing, chest pain)      Chest pain when I take a deep breath 11. PREGNANCY: "Is there any chance you are pregnant?" "When was your last menstrual period?"       N/A 12. TRAVEL: "Have you traveled out of the country in the last month?" (e.g., travel history, exposures)       No  Protocols used: COUGH - ACUTE NON-PRODUCTIVE-A-AH, CORONAVIRUS (COVID-19) EXPOSURE-A-AH

## 2018-05-19 NOTE — Telephone Encounter (Signed)
Noted  

## 2018-05-19 NOTE — Telephone Encounter (Signed)
Questions for Screening COVID-19  Symptom onset:  Started about 3 weeks ago with chest congestion and hoarseness Went to a conference (several ppl from other states present) - since then he developed dry  And fever.  Some SOB - CP in lower 1/3 of chest - difficult to take a full breath and pain with full breath.  Fever - 99-101   Taking Tylenol for Sx's some sore throat   Travel or Contacts:  Went to a conference (several ppl from other states present) in [place]  During this illness, did/does the patient experience any of the following symptoms? Fever >100.49F [x]   Yes []   No []   Unknown Subjective fever (felt feverish) [x]   Yes []   No []   Unknown Chills []   Yes [x]   No []   Unknown Muscle aches (myalgia) []   Yes [x]   No []   Unknown Runny nose (rhinorrhea) []   Yes [x]   No []   Unknown Sore throat []   Yes [x]   No []   Unknown Cough (new onset or worsening of chronic cough) [x]   Yes []   No []   Unknown Shortness of breath (dyspnea) [x]   Yes []   No []   Unknown Nausea or vomiting []   Yes [x]   No []   Unknown Headache [x]   Yes []   No []   Unknown Abdominal pain  []   Yes [x]   No []   Unknown Diarrhea (?3 loose/looser than normal stools/24hr period) []   Yes [x]   No []   Unknown Other, specify:  Patient risk factors: Smoker? []   Current [x]   Former []   Never If male, currently pregnant? []   Yes []   No  Patient Active Problem List   Diagnosis Date Noted  . 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 Numbness of finger 04/11/2015  . Labral tear of shoulder 01/29/2014  . Acromioclavicular joint arthritis 01/29/2014  . Chest pain 09/04/2012  . Dog bite of hand 09/04/2012  . Hypertension 10/14/2010  . Kidney stones 10/14/2010  . GERD (gastroesophageal reflux disease) 10/14/2010  . History of depression 10/14/2010  . Family history of colon cancer 10/14/2010    Plan:  []   High risk for COVID-19 with red flags go to ED (with CP, SOB, weak/lightheaded, or fever > 101.5).  Call ahead.  [x]   High risk for COVID-19 but stable will have car visit. Inform provider and coordinate time. Will be completed in afternoon. []   No red flags but URI signs or symptoms will go through side door and be seen in dedicated room.  Note: Referral to telemedicine is an appropriate alternative disposition for higher risk but stable. [facility]/e-Visit: [phone].

## 2018-05-19 NOTE — Addendum Note (Signed)
Addended by: Apolinar Junes on: 05/19/2018 01:55 PM   Modules accepted: Orders

## 2018-05-19 NOTE — Telephone Encounter (Signed)
Per Dorothyann Peng, NP - send to testing tent.  Order to be placed.  appt cancelled for today.  Will send note to [facility] to sign orders.

## 2018-05-24 ENCOUNTER — Encounter: Payer: Self-pay | Admitting: Adult Health

## 2018-05-24 LAB — NOVEL CORONAVIRUS, NAA: SARS-CoV-2, NAA: NOT DETECTED

## 2018-06-09 ENCOUNTER — Ambulatory Visit (AMBULATORY_SURGERY_CENTER): Payer: Self-pay

## 2018-06-09 ENCOUNTER — Other Ambulatory Visit: Payer: Self-pay

## 2018-06-09 VITALS — Ht 67.0 in | Wt 170.0 lb

## 2018-06-09 DIAGNOSIS — Z800 Family history of malignant neoplasm of digestive organs: Secondary | ICD-10-CM

## 2018-06-09 MED ORDER — PEG 3350-KCL-NA BICARB-NACL 420 G PO SOLR: 4000.0000 mL | Freq: Once | ORAL | 0 refills | Status: AC

## 2018-06-09 NOTE — Progress Notes (Signed)
Denies allergies to eggs or soy products. Denies complication of anesthesia or sedation. Denies use of weight loss medication. Denies use of O2.   Emmi instructions given for colonoscopy.  Pre-Visit was conducted by phone due to Covid 19. Insurance ID number was verified. Instructions were mailed to patients confirmed home address. Patient was encouraged to call with any questions or concerns.

## 2018-06-24 ENCOUNTER — Encounter: Payer: BC Managed Care – PPO | Admitting: Gastroenterology

## 2018-07-04 ENCOUNTER — Telehealth: Payer: Self-pay | Admitting: *Deleted

## 2018-07-04 NOTE — Telephone Encounter (Signed)
Called pt, no answer on both numbers. Left message for him to call us back to reschedule his colonoscopy. Dr.Jacobs is not in [place] on 07/26/2018.

## 2018-07-05 ENCOUNTER — Telehealth: Payer: Self-pay | Admitting: *Deleted

## 2018-07-05 NOTE — Telephone Encounter (Signed)
Called patient to reschedule 5/26 procedure d/t schedule changes.  Rescheduled for 5/29 @ 2:30.  Will send new instructions via My Chart.

## 2018-07-05 NOTE — Telephone Encounter (Signed)
New prep instructions sent via MyChart 

## 2018-07-14 IMAGING — CR DG CHEST 2V
2 series · 2 of 2 positions shown · non-contrast
Comparison: Chest radiograph performed 09/04/2012

CLINICAL DATA: Acute onset of upper mid chest pain. High blood
pressure. Initial encounter.

EXAM:
CHEST  2 VIEW

[w chest pa]
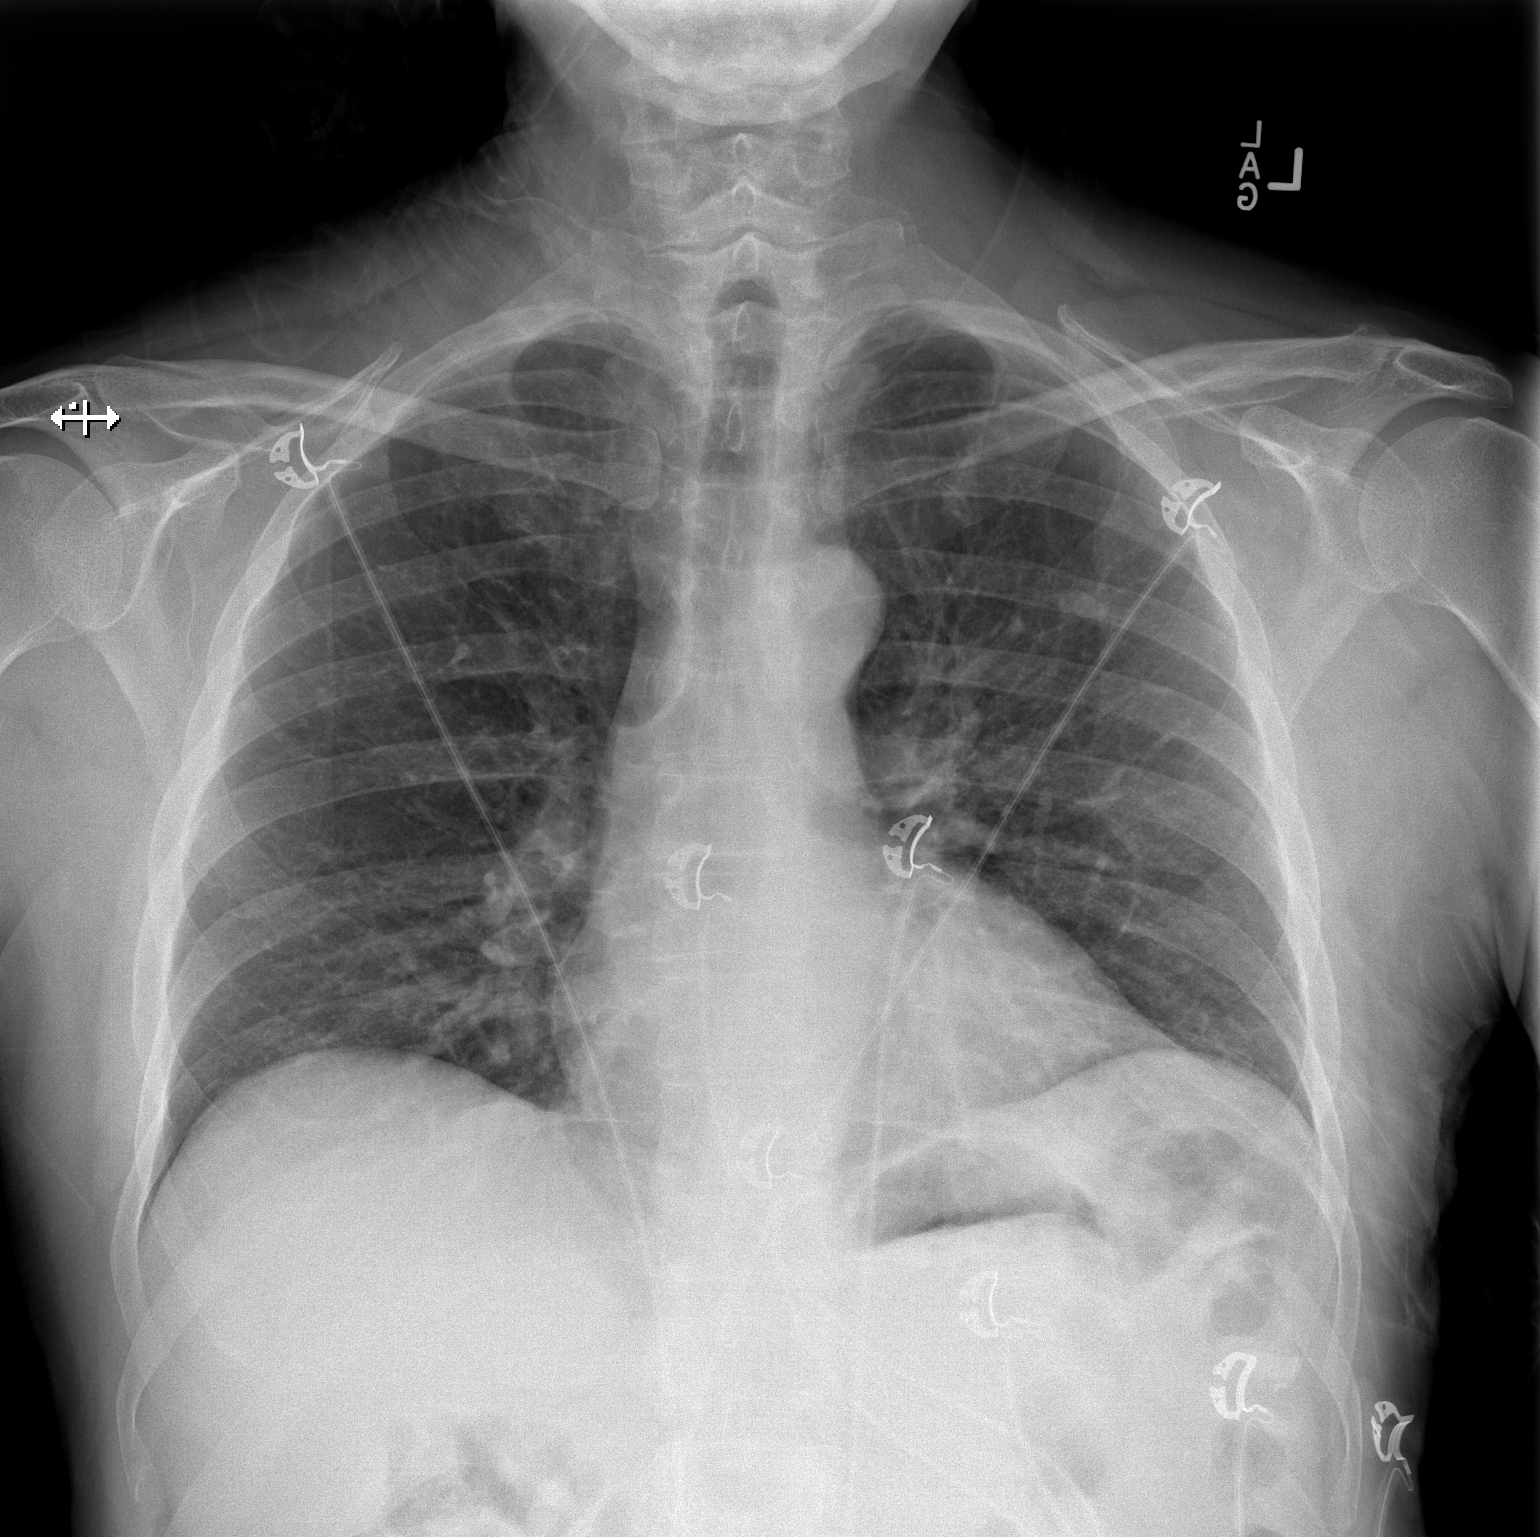

[w chest lat]
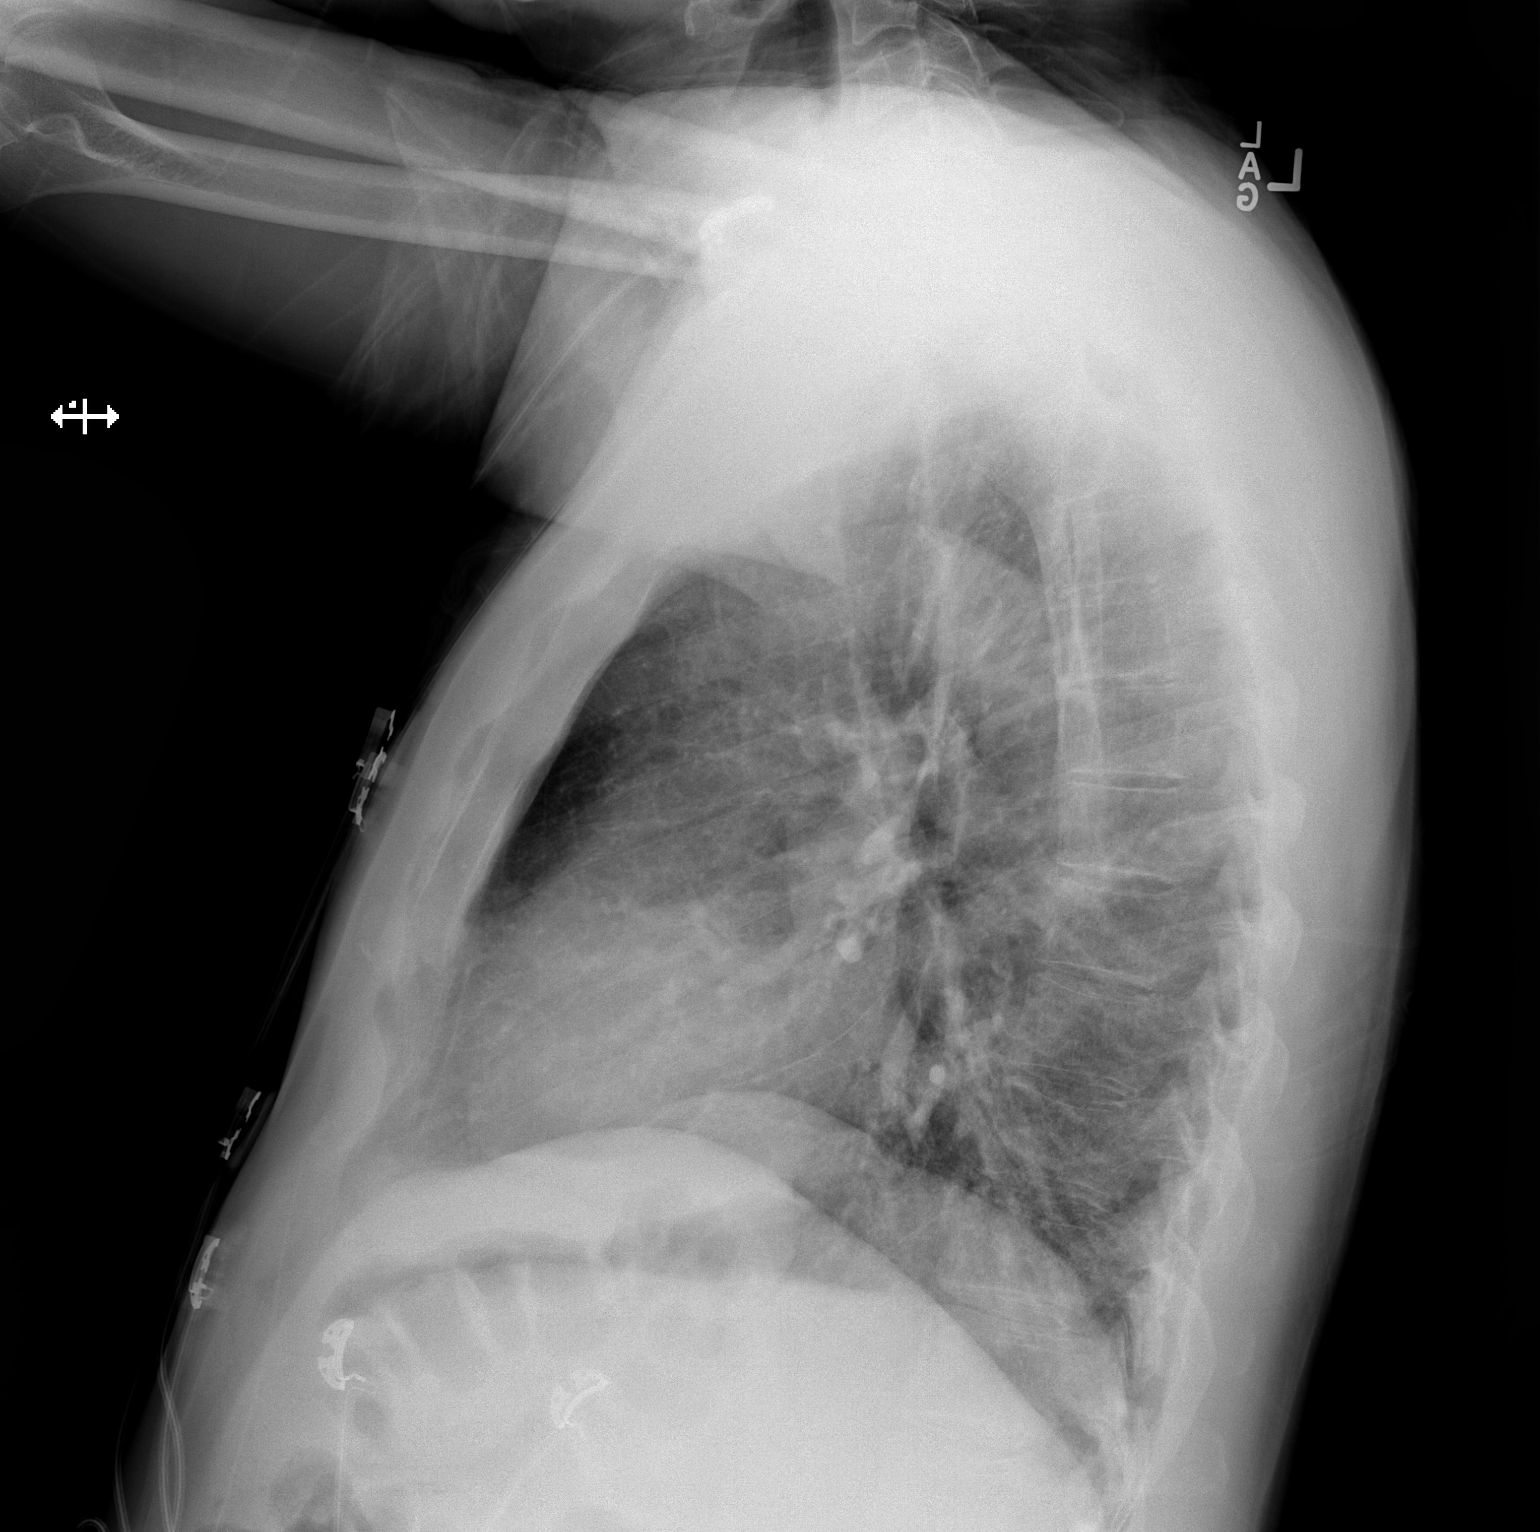

[2 of 2 positions shown; findings below may reference images not displayed]

FINDINGS: The lungs are well-aerated. Mild vascular congestion is noted. There
is no evidence of focal opacification, pleural effusion or
pneumothorax. Bilateral calcified granulomata are suggested. A
cm left midlung nodular opacity has increased only slightly in size
from 5215 and likely reflects a calcified granuloma.

The heart is normal in size; the mediastinal contour is within
normal limits. No acute osseous abnormalities are seen.
IMPRESSION: Mild vascular congestion noted.  Lungs remain grossly clear.

## 2018-07-15 ENCOUNTER — Encounter: Payer: Self-pay | Admitting: Gastroenterology

## 2018-07-26 ENCOUNTER — Encounter: Payer: BC Managed Care – PPO | Admitting: Gastroenterology

## 2018-07-29 ENCOUNTER — Encounter: Payer: BC Managed Care – PPO | Admitting: Gastroenterology

## 2018-07-31 ENCOUNTER — Telehealth: Payer: Self-pay | Admitting: *Deleted

## 2018-07-31 NOTE — Telephone Encounter (Signed)
Covid-19 screening questions  Have you traveled in the last 14 days? No If yes where?  Do you now or have you had a fever in the last 14 days? No  Do you have any respiratory symptoms of shortness of breath or cough now or in the last 14 days? No  Do you have any family members or close contacts with diagnosed or suspected Covid-19 in the past 14 days? No  Have you been tested for Covid-19 and found to be positive? Yes in March and test was negative

## 2018-08-02 ENCOUNTER — Encounter: Payer: Self-pay | Admitting: Gastroenterology

## 2018-08-02 ENCOUNTER — Other Ambulatory Visit: Payer: Self-pay

## 2018-08-02 ENCOUNTER — Ambulatory Visit (AMBULATORY_SURGERY_CENTER): Payer: BC Managed Care – PPO | Admitting: Gastroenterology

## 2018-08-02 VITALS — BP 147/90 | HR 66 | Temp 98.6°F | Resp 9 | Ht 67.0 in | Wt 170.0 lb

## 2018-08-02 DIAGNOSIS — Z800 Family history of malignant neoplasm of digestive organs: Secondary | ICD-10-CM | POA: Diagnosis present

## 2018-08-02 DIAGNOSIS — Z1211 Encounter for screening for malignant neoplasm of colon: Secondary | ICD-10-CM | POA: Diagnosis not present

## 2018-08-02 MED ORDER — SODIUM CHLORIDE 0.9 % IV SOLN: 500.0000 mL | Freq: Once | INTRAVENOUS | Status: DC

## 2018-08-02 NOTE — Patient Instructions (Signed)
YOU HAD AN ENDOSCOPIC PROCEDURE TODAY AT THE [facility]:   Refer to the procedure report that was given to you for any specific questions about what was found during the examination.  If the procedure report does not answer your questions, please call your gastroenterologist to clarify.  If you requested that your care partner not be given the details of your procedure findings, then the procedure report has been included in a sealed envelope for you to review at your convenience later.  YOU SHOULD EXPECT: Some feelings of bloating in the abdomen. Passage of more gas than usual.  Walking can help get rid of the air that was put into your GI tract during the procedure and reduce the bloating. If you had a lower endoscopy (such as a colonoscopy or flexible sigmoidoscopy) you may notice spotting of blood in your stool or on the toilet paper. If you underwent a bowel prep for your procedure, you may not have a normal bowel movement for a few days.  Please Note:  You might notice some irritation and congestion in your nose or some drainage.  This is from the oxygen used during your procedure.  There is no need for concern and it should clear up in a day or so.  SYMPTOMS TO REPORT IMMEDIATELY:   Following lower endoscopy (colonoscopy or flexible sigmoidoscopy):  Excessive amounts of blood in the stool  Significant tenderness or worsening of abdominal pains  Swelling of the abdomen that is new, acute  Fever of 100F or higher  For urgent or emergent issues, a gastroenterologist can be reached at any hour by calling [phone].   DIET:  We do recommend a small meal at first, but then you may proceed to your regular diet.  Drink plenty of fluids but you should avoid alcoholic beverages for 24 hours.  ACTIVITY:  You should plan to take it easy for the rest of today and you should NOT DRIVE or use heavy machinery until tomorrow (because of the sedation medicines used during the test).     FOLLOW UP: Our staff will call the number listed on your records 48-72 hours following your procedure to check on you and address any questions or concerns that you may have regarding the information given to you following your procedure. If we do not reach you, we will leave a message.  We will attempt to reach you two times.  During this call, we will ask if you have developed any symptoms of COVID 19. If you develop any symptoms (ie: fever, flu-like symptoms, shortness of breath, cough etc.) before then, please call [phone].  If you test positive for Covid 19 in the 2 weeks post procedure, please call and report this information to us.    If any biopsies were taken you will be contacted by phone or by letter within the next 1-3 weeks.  Please call us at [phone] if you have not heard about the biopsies in 3 weeks.    SIGNATURES/CONFIDENTIALITY: You and/or your care partner have signed paperwork which will be entered into your electronic medical record.  These signatures attest to the fact that that the information above on your After Visit Summary has been reviewed and is understood.  Full responsibility of the confidentiality of this discharge information lies with you and/or your care-partner. 

## 2018-08-02 NOTE — Progress Notes (Signed)
Courtney Washington , CMA- Temp Judy Branson, CMA- Vitals  

## 2018-08-02 NOTE — Op Note (Addendum)
[place] Patient Name: Donald Clark Procedure Date: 08/02/2018 1:44 PM MRN: [id number] Endoscopist: Milus Banister , MD Age: 58 Referring MD:  Date of Birth: [birth year] Gender: Male Account #: [id number] Procedure:                Colonoscopy Indications:              Screening patient at increased risk: Family history                            of colorectal cancer in multiple 1st-degree                            relatives; mother and father both had colorectal                            cancer (diagnosed in their early 71s) Medicines:                Monitored Anesthesia Care Procedure:                Pre-Anesthesia Assessment:                           - Prior to the procedure, a History and Physical                            was performed, and patient medications and                            allergies were reviewed. The patient's tolerance of                            previous anesthesia was also reviewed. The risks                            and benefits of the procedure and the sedation                            options and risks were discussed with the patient.                            All questions were answered, and informed consent                            was obtained. Prior Anticoagulants: The patient has                            taken no previous anticoagulant or antiplatelet                            agents. ASA Grade Assessment: II - A patient with                            mild systemic disease. After reviewing the risks  and benefits, the patient was deemed in                            satisfactory condition to undergo the procedure.                           After obtaining informed consent, the colonoscope                            was passed under direct vision. Throughout the                            procedure, the patient's blood pressure, pulse, and                            oxygen saturations were  monitored continuously. The                            Model CF-HQ190L [id number]) scope was introduced                            through the anus and advanced to the the cecum,                            identified by appendiceal orifice and ileocecal                            valve. The colonoscopy was performed without                            difficulty. The patient tolerated the procedure                            well. The quality of the bowel preparation was                            good. The ileocecal valve, appendiceal orifice, and                            rectum were photographed. Scope In: 1:50:52 PM Scope Out: 2:02:31 PM Scope Withdrawal Time: 0 hours 8 minutes 1 second  Total Procedure Duration: 0 hours 11 minutes 39 seconds  Findings:                 The entire examined colon appeared normal on direct                            and retroflexion views. Complications:            No immediate complications. Estimated blood loss:                            None. Estimated Blood Loss:     none Impression:               - The entire examined colon is normal  on direct and                            retroflexion views.                           - No polyps or cancers. Recommendation:           - Patient has a contact number available for                            emergencies. The signs and symptoms of potential                            delayed complications were discussed with the                            patient. Return to normal activities tomorrow.                            Written discharge instructions were provided to the                            patient.                           - Resume previous diet.                           - Continue present medications.                           - Repeat colonoscopy in 5 years for screening. Milus Banister, MD 08/02/2018 2:05:28 PM This report has been signed electronically.

## 2018-08-02 NOTE — Progress Notes (Signed)
Report given to PACU, vss 

## 2018-08-04 ENCOUNTER — Telehealth: Payer: Self-pay | Admitting: *Deleted

## 2018-08-04 NOTE — Telephone Encounter (Signed)
  Follow up Call-  Call back number 08/02/2018  Post procedure Call Back phone  # [phone]  Permission to leave phone message Yes  Some recent data might be hidden     Patient questions:  Do you have a fever, pain , or abdominal swelling? No. Pain Score  0 *  Have you tolerated food without any problems? Yes.    Have you been able to return to your normal activities? Yes.    Do you have any questions about your discharge instructions: Diet   No. Medications  No. Follow up visit  No.  Do you have questions or concerns about your Care? No.  Actions: * If pain score is 4 or above: No action needed, pain <4.  1. Have you developed a fever since your procedure? no  2.   Have you had an respiratory symptoms (SOB or cough) since your procedure? no  3.   Have you tested positive for COVID 19 since your procedure no  4.   Have you had any family members/close contacts diagnosed with the COVID 19 since your procedure?  no   If yes to any of these questions please route to Joylene John, RN and Alphonsa Gin, Therapist, sports.

## 2018-12-29 ENCOUNTER — Other Ambulatory Visit: Payer: Self-pay

## 2018-12-29 ENCOUNTER — Ambulatory Visit: Payer: BC Managed Care – PPO | Admitting: Family Medicine

## 2018-12-29 ENCOUNTER — Encounter: Payer: Self-pay | Admitting: Family Medicine

## 2018-12-29 VITALS — BP 124/78 | HR 85 | Temp 98.7°F | Ht 67.0 in | Wt 185.4 lb

## 2018-12-29 DIAGNOSIS — M546 Pain in thoracic spine: Secondary | ICD-10-CM | POA: Diagnosis not present

## 2018-12-29 DIAGNOSIS — Z23 Encounter for immunization: Secondary | ICD-10-CM

## 2018-12-29 MED ORDER — CARISOPRODOL 350 MG PO TABS: 350.0000 mg | ORAL_TABLET | Freq: Four times a day (QID) | ORAL | 0 refills | Status: DC | PRN

## 2018-12-29 NOTE — Patient Instructions (Signed)
Try some muscle massage and continue with topical heat  Use the Soma with caution- can cause sedation.    Let me know if pain not improving over next week.

## 2018-12-29 NOTE — Progress Notes (Signed)
Subjective:     Patient ID: Donald Clark, male   DOB: [birth year], 59 y.o.   MRN: [id number]  HPI [name] is seen with mid thoracic back pain.  Onset about 3 to 4 weeks ago.  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  He states he does not have particularly great support for his back.  He has pain which is slightly worse on the left side and around the mid thoracic region.  He rolled his back against a tennis ball and noticed what felt like some "knots "especially in the left longissimus dorsi muscle.  No known injury.  He states his muscles feel "tight ".  He took some ibuprofen 800 mg without much relief and tried heat without much relief.  No recent fevers or chills.  No appetite changes.  Has had some recent weight gain.  No dyspnea.  No cough.  No recent skin rash.  He states he has taken Flexeril in the past but this made him quite sedated.  He tolerated Soma much better.  Past Medical History:  Diagnosis Date  . Allergy   . Anxiety   . Asthma   . Cancer ([place])    skin- multiple removed-1 basal cell, 1 squamous cell  . Chicken pox   . Chronic kidney disease    stones  . Depression   . GERD (gastroesophageal reflux disease)   . Hypertension   . Prostatitis    Past Surgical History:  Procedure Laterality Date  . COLONOSCOPY    . EXTRACORPOREAL SHOCK WAVE LITHOTRIPSY Right 03/29/2017   Procedure: RIGHT EXTRACORPOREAL SHOCK WAVE LITHOTRIPSY (ESWL);  Surgeon: Nickie Retort, MD;  Location: [facility];  Service: Urology;  Laterality: Right;  . LITHOTRIPSY    . skin cancer removed     on ankle, back neck  . UPPER GASTROINTESTINAL ENDOSCOPY      reports that he quit smoking about 38 years ago. His smoking use included cigarettes. He has a 2.00 pack-year smoking history. He has never used smokeless tobacco. He reports current alcohol use. He reports that he does not use drugs. family history  includes Cancer in his mother; Cancer (age of onset: 52) in his father; Colon cancer in his father and mother; Diabetes in his maternal grandfather and paternal grandmother; Hypertension in his mother; Rectal cancer in his father. Allergies  Allergen Reactions  . Penicillins     GI upset Has patient had a PCN reaction causing immediate rash, facial/tongue/throat swelling, SOB or lightheadedness with hypotension: No Has patient had a PCN reaction causing severe rash involving mucus membranes or skin necrosis: No Has patient had a PCN reaction that required hospitalization no Has patient had a PCN reaction occurring within the last 10 years: Yes If all of the above answers are "NO", then may proceed with Cephalosporin use.   . Sulfa Antibiotics     Hives, joint pain, rash     Review of Systems  Constitutional: Negative for appetite change, chills, fever and unexpected weight change.  Respiratory: Negative for cough and shortness of breath.   Cardiovascular: Negative for chest pain.  Gastrointestinal: Negative for abdominal pain, nausea and vomiting.  Musculoskeletal: Positive for back pain.  Neurological: Negative for weakness.       Objective:   Physical Exam Constitutional:      Appearance: Normal appearance.  Cardiovascular:     Rate  and Rhythm: Normal rate and regular rhythm.  Pulmonary:     Effort: Pulmonary effort is normal.     Breath sounds: Normal breath sounds.  Musculoskeletal:     Comments: He has some tenderness around the mid thoracic area especially just left of the spine.  This is over somewhat of a diffuse area spanning a few centimeters.  Neurological:     Mental Status: He is alert.     Comments: No focal strength deficits        Assessment:     Thoracic back pain.  Suspect muscular.  Does not have any red flags such as fever, weight loss, cough, dyspnea    Plan:     -Suggest that he consider some muscle massage -Continue topical heat and consider  over-the-counter sports creams -Limited Soma 350 mg every 6-8 hours as needed for muscle spasm.  He is aware of potential for sedation -Consider physical therapy and/or follow-up for further assessment if not improving over the next week -Flu vaccine given per patient request  Eulas Post MD [facility] at [facility]

## 2019-01-10 ENCOUNTER — Encounter: Payer: Self-pay | Admitting: Family Medicine

## 2019-01-10 ENCOUNTER — Ambulatory Visit: Payer: BC Managed Care – PPO | Admitting: Family Medicine

## 2019-01-10 ENCOUNTER — Other Ambulatory Visit: Payer: Self-pay

## 2019-01-10 VITALS — BP 110/78 | HR 87 | Temp 98.9°F | Ht 67.0 in | Wt 183.9 lb

## 2019-01-10 DIAGNOSIS — M546 Pain in thoracic spine: Secondary | ICD-10-CM | POA: Diagnosis not present

## 2019-01-10 NOTE — Progress Notes (Signed)
Subjective:     Patient ID: Donald Clark, male   DOB: Jan 19, 1961, 58 y.o.   MRN: [id number]  HPI   [name] seen for follow-up regarding thoracic back pain.  He started on some Soma which he is taking about 1/2 tablet twice daily and thinks it may have helped some.  He thinks his back pain is slightly better.  However, he has developed some bilateral hip pain more sacroiliac region.  We discussed that if his thoracic pain was not improving consider further evaluation given the fact that we see somewhat less musculoskeletal back pain in this region compared to lumbar and cervical.  He has not had any red flags such as fever, night sweats, appetite loss, weight loss.  Pain in thoracic areas somewhat achy and moderate intensity.  Denies any other arthralgias.  His hip pain tends to improve after ambulation.  Past Medical History:  Diagnosis Date  . Allergy   . Anxiety   . Asthma   . Cancer ([place])    skin- multiple removed-1 basal cell, 1 squamous cell  . Chicken pox   . Chronic kidney disease    stones  . Depression   . GERD (gastroesophageal reflux disease)   . Hypertension   . Prostatitis    Past Surgical History:  Procedure Laterality Date  . COLONOSCOPY    . EXTRACORPOREAL SHOCK WAVE LITHOTRIPSY Right 03/29/2017   Procedure: RIGHT EXTRACORPOREAL SHOCK WAVE LITHOTRIPSY (ESWL);  Surgeon: Nickie Retort, MD;  Location: [facility];  Service: Urology;  Laterality: Right;  . LITHOTRIPSY    . skin cancer removed     on ankle, back neck  . UPPER GASTROINTESTINAL ENDOSCOPY      reports that he quit smoking about 38 years ago. His smoking use included cigarettes. He has a 2.00 pack-year smoking history. He has never used smokeless tobacco. He reports current alcohol use. He reports that he does not use drugs. family history includes Cancer in his mother; Cancer (age of onset: 58) in his father; Colon cancer in his father and mother; Diabetes in his maternal grandfather and paternal  grandmother; Hypertension in his mother; Rectal cancer in his father. Allergies  Allergen Reactions  . Penicillins     GI upset Has patient had a PCN reaction causing immediate rash, facial/tongue/throat swelling, SOB or lightheadedness with hypotension: No Has patient had a PCN reaction causing severe rash involving mucus membranes or skin necrosis: No Has patient had a PCN reaction that required hospitalization no Has patient had a PCN reaction occurring within the last 10 years: Yes If all of the above answers are "NO", then may proceed with Cephalosporin use.   . Sulfa Antibiotics     Hives, joint pain, rash     Review of Systems  Constitutional: Negative for appetite change, chills, fever and unexpected weight change.  Respiratory: Negative for cough and shortness of breath.   Cardiovascular: Negative for chest pain, palpitations and leg swelling.  Gastrointestinal: Negative for abdominal pain.  Musculoskeletal: Positive for back pain.  Neurological: Negative for weakness.       Objective:   Physical Exam Vitals signs reviewed.  Constitutional:      Appearance: Normal appearance.  Cardiovascular:     Rate and Rhythm: Normal rate and regular rhythm.  Pulmonary:     Effort: Pulmonary effort is normal.     Breath sounds: Normal breath sounds.  Musculoskeletal:     Comments: Much less tender parathoracic region bilaterally.  Minimal mild tenderness over  the mid thoracic region.  Not very well localized.  Full range of motion both hips  Neurological:     Mental Status: He is alert.        Assessment:     Midthoracic back pain.  Slightly improved on muscle relaxer.  Again, no red flags such as weight loss, fever, night sweats, etc.    Plan:     -We elected to go ahead and get some labs to further assess with sed rate, C-reactive protein, CBC, comprehensive metabolic panel.  If continuing to get better would not pursue any further imaging or other work-up at this  time. -We did discuss possible physical therapy if he feels this does not continue to improve over the next couple weeks -Discussed avoidance of regular use of Soma  Eulas Post MD [facility] at [facility]

## 2019-01-11 ENCOUNTER — Encounter: Payer: Self-pay | Admitting: Family Medicine

## 2019-01-11 LAB — COMPREHENSIVE METABOLIC PANEL
ALT: 28 U/L (ref 0–53)
AST: 23 U/L (ref 0–37)
Albumin: 4.8 g/dL (ref 3.5–5.2)
Alkaline Phosphatase: 75 U/L (ref 39–117)
BUN: 18 mg/dL (ref 6–23)
CO2: 29 mEq/L (ref 19–32)
Calcium: 9.5 mg/dL (ref 8.4–10.5)
Chloride: 104 mEq/L (ref 96–112)
Creatinine, Ser: 0.98 mg/dL (ref 0.40–1.50)
GFR: 78.28 mL/min (ref >60.00)
Glucose, Bld: 106 mg/dL — ABNORMAL HIGH (ref 70–99)
Potassium: 3.8 mEq/L (ref 3.5–5.1)
Sodium: 141 mEq/L (ref 135–145)
Total Bilirubin: 0.4 mg/dL (ref 0.2–1.2)
Total Protein: 6.9 g/dL (ref 6.0–8.3)

## 2019-01-11 LAB — CBC WITH DIFFERENTIAL/PLATELET
Basophils Absolute: 0 10*3/uL (ref 0.0–0.1)
Basophils Relative: 0.4 % (ref 0.0–3.0)
Eosinophils Absolute: 0.1 10*3/uL (ref 0.0–0.7)
Eosinophils Relative: 1.9 % (ref 0.0–5.0)
HCT: 44.5 % (ref 39.0–52.0)
Hemoglobin: 15.4 g/dL (ref 13.0–17.0)
Lymphocytes Relative: 18.5 % (ref 12.0–46.0)
Lymphs Abs: 1.3 10*3/uL (ref 0.7–4.0)
MCHC: 34.5 g/dL (ref 30.0–36.0)
MCV: 86.1 fl (ref 78.0–100.0)
Monocytes Absolute: 0.5 10*3/uL (ref 0.1–1.0)
Monocytes Relative: 7.2 % (ref 3.0–12.0)
Neutro Abs: 5 10*3/uL (ref 1.4–7.7)
Neutrophils Relative %: 72 % (ref 43.0–77.0)
Platelets: 224 10*3/uL (ref 150.0–400.0)
RBC: 5.17 Mil/uL (ref 4.22–5.81)
RDW: 12.9 % (ref 11.5–15.5)
WBC: 6.9 10*3/uL (ref 4.0–10.5)

## 2019-01-11 LAB — C-REACTIVE PROTEIN: CRP: <1 mg/dL (ref 0.5–20.0)

## 2019-01-11 LAB — SEDIMENTATION RATE: Sed Rate: 2 mm/hr (ref 0–20)

## 2019-02-14 ENCOUNTER — Other Ambulatory Visit: Payer: Self-pay | Admitting: Family Medicine

## 2019-03-22 ENCOUNTER — Other Ambulatory Visit: Payer: Self-pay | Admitting: Family Medicine

## 2019-04-29 ENCOUNTER — Ambulatory Visit: Payer: BC Managed Care – PPO | Attending: Internal Medicine

## 2019-10-01 ENCOUNTER — Other Ambulatory Visit: Payer: Self-pay | Admitting: Family Medicine

## 2019-12-28 ENCOUNTER — Other Ambulatory Visit: Payer: Self-pay | Admitting: Family Medicine

## 2020-03-05 ENCOUNTER — Encounter: Payer: Self-pay | Admitting: Family Medicine

## 2020-03-05 ENCOUNTER — Ambulatory Visit (INDEPENDENT_AMBULATORY_CARE_PROVIDER_SITE_OTHER): Payer: BC Managed Care – PPO | Admitting: Family Medicine

## 2020-03-05 ENCOUNTER — Other Ambulatory Visit: Payer: Self-pay

## 2020-03-05 VITALS — BP 128/80 | HR 85 | Ht 67.0 in | Wt 174.0 lb

## 2020-03-05 DIAGNOSIS — R519 Headache, unspecified: Secondary | ICD-10-CM | POA: Diagnosis not present

## 2020-03-05 DIAGNOSIS — R0789 Other chest pain: Secondary | ICD-10-CM

## 2020-03-05 DIAGNOSIS — Z23 Encounter for immunization: Secondary | ICD-10-CM

## 2020-03-05 MED ORDER — LORAZEPAM 0.5 MG PO TABS: 0.5000 mg | ORAL_TABLET | Freq: Three times a day (TID) | ORAL | 0 refills | Status: DC | PRN

## 2020-03-05 NOTE — Patient Instructions (Signed)

## 2020-03-05 NOTE — Progress Notes (Signed)
Established Patient Office Visit  Subjective:  Patient ID: FARDIN MALSON, male    DOB: 1960-03-04  Age: 60 y.o. MRN: [id number]  CC:  Chief Complaint  Patient presents with  . Hypertension  . Anxiety    HPI HARDIN DAR presents for evaluation the following items  He relates some chest tightness.  This is nonexertional.  This is relatively continuous.  This is fairly diffuse.  He has had tremendous stress recently with increased work stress and also helping raise nieces which are age 4 and 74.  He feels like he has very little downtime.  No dyspnea.  No family history of premature heart disease.  No radiation of discomfort to the neck or shoulder.  No dysphagia.  No cough.  No pleuritic pain.  He thinks this may be stress related.  Generally not sleeping very well.  He gets to sleep but has difficulty staying asleep.  He relates around Thanksgiving having acute fairly severe headache.  He states since that time he has had some low-grade type headaches which are somewhat unilateral and usually improved with putting pressure directly over his scalp.  He had somewhat atypical headache back in 2018.  MRI of the brain at that time revealed no acute abnormalities.  He was seen by neurology and had some type of injection therapy which seemed to help.  He is taken Tylenol and aspirin with transient improvement in headaches.  No nausea or vomiting.  No visual changes.  Past Medical History:  Diagnosis Date  . Allergy   . Anxiety   . Asthma   . Cancer (HCC)    skin- multiple removed-1 basal cell, 1 squamous cell  . Chicken pox   . Chronic kidney disease    stones  . Depression   . GERD (gastroesophageal reflux disease)   . Hypertension   . Prostatitis     Past Surgical History:  Procedure Laterality Date  . COLONOSCOPY    . EXTRACORPOREAL SHOCK WAVE LITHOTRIPSY Right 03/29/2017   Procedure: RIGHT EXTRACORPOREAL SHOCK WAVE LITHOTRIPSY (ESWL);  Surgeon: Hildred Laser, MD;   Location: [facility];  Service: Urology;  Laterality: Right;  . LITHOTRIPSY    . skin cancer removed     on ankle, back neck  . UPPER GASTROINTESTINAL ENDOSCOPY      Family History  Problem Relation Age of Onset  . Cancer Mother        colon, uterine CA  . Hypertension Mother   . Colon cancer Mother   . Cancer Father 63       colon cancer  . Rectal cancer Father   . Colon cancer Father   . Diabetes Maternal Grandfather   . Diabetes Paternal Grandmother   . Esophageal cancer Neg Hx   . Stomach cancer Neg Hx   . Ulcerative colitis Neg Hx     Social History   Socioeconomic History  . Marital status: Married    Spouse name: Not on file  . Number of children: Not on file  . Years of education: Not on file  . Highest education level: Not on file  Occupational History  . Not on file  Tobacco Use  . Smoking status: Former Smoker    Packs/day: 0.50    Years: 4.00    Pack years: 2.00    Types: Cigarettes    Quit date: 10/13/1980    Years since quitting: 39.4  . Smokeless tobacco: Never Used  Vaping Use  . Vaping Use:  Never used  Substance and Sexual Activity  . Alcohol use: Yes    Alcohol/week: 0.0 standard drinks    Comment: occ  . Drug use: No  . Sexual activity: Yes  Other Topics Concern  . Not on file  Social History Narrative  . Not on file   Social Determinants of Health   Financial Resource Strain: Not on file  Food Insecurity: Not on file  Transportation Needs: Not on file  Physical Activity: Not on file  Stress: Not on file  Social Connections: Not on file  Intimate Partner Violence: Not on file    Outpatient Medications Prior to Visit  Medication Sig Dispense Refill  . amLODipine (NORVASC) 10 MG tablet TAKE 1 TABLET BY MOUTH EVERY DAY 90 tablet 0  . buPROPion (WELLBUTRIN XL) 300 MG 24 hr tablet TAKE 1 TABLET BY MOUTH EVERY DAY 90 tablet 0  . carisoprodol (SOMA) 350 MG tablet Take 1 tablet (350 mg total) by mouth 4 (four) times daily as needed for  muscle spasms. 30 tablet 0  . ibuprofen (ADVIL,MOTRIN) 200 MG tablet Take 400 mg by mouth every 6 (six) hours as needed for moderate pain.    Marland Kitchen loratadine (CLARITIN) 10 MG tablet Take 10 mg by mouth daily as needed for allergies.     . Multiple Vitamin (MULTIVITAMIN WITH MINERALS) TABS Take 1 tablet by mouth daily. Reported on 06/12/2015    . OVER THE COUNTER MEDICATION Zinc 50 mg One tablet daily.    Marland Kitchen OVER THE COUNTER MEDICATION Selenium, 200 mcg. One tablet daily.    . Tadalafil (CIALIS PO) Take by mouth.     No facility-administered medications prior to visit.    Allergies  Allergen Reactions  . Penicillins     GI upset Has patient had a PCN reaction causing immediate rash, facial/tongue/throat swelling, SOB or lightheadedness with hypotension: No Has patient had a PCN reaction causing severe rash involving mucus membranes or skin necrosis: No Has patient had a PCN reaction that required hospitalization no Has patient had a PCN reaction occurring within the last 10 years: Yes If all of the above answers are "NO", then may proceed with Cephalosporin use.   . Sulfa Antibiotics     Hives, joint pain, rash    ROS Review of Systems  Constitutional: Negative for appetite change, chills, fever and unexpected weight change.  Eyes: Negative for visual disturbance.  Respiratory: Negative for cough and shortness of breath.   Cardiovascular: Positive for chest pain.  Neurological: Positive for headaches. Negative for dizziness, seizures, syncope and weakness.  Hematological: Negative for adenopathy.  Psychiatric/Behavioral: Positive for dysphoric mood and sleep disturbance. The patient is nervous/anxious.       Objective:    Physical Exam Vitals reviewed.  Constitutional:      Appearance: Normal appearance.  Cardiovascular:     Rate and Rhythm: Normal rate and regular rhythm.     Heart sounds: Normal heart sounds.  Pulmonary:     Effort: Pulmonary effort is normal.     Breath  sounds: Normal breath sounds.  Musculoskeletal:     Cervical back: Neck supple.  Neurological:     General: No focal deficit present.     Mental Status: He is alert.     Cranial Nerves: No cranial nerve deficit.  Psychiatric:     Comments: PHQ-9 score of 12  GAD-7 scale score of 16     BP 128/80   Pulse 85   Ht 5\' 7"  (1.702 m)  Wt 174 lb (78.9 kg)   SpO2 98%   BMI 27.25 kg/m  Wt Readings from Last 3 Encounters:  03/05/20 174 lb (78.9 kg)  01/10/19 183 lb 14.4 oz (83.4 kg)  12/29/18 185 lb 6.4 oz (84.1 kg)     Health Maintenance Due  Topic Date Due  . COVID-19 Vaccine (1) Never done  . HIV Screening  Never done  . TETANUS/TDAP  11/14/2019    There are no preventive care reminders to display for this patient.  Lab Results  Component Value Date   TSH 1.63 03/01/2018   Lab Results  Component Value Date   WBC 6.9 01/10/2019   HGB 15.4 01/10/2019   HCT 44.5 01/10/2019   MCV 86.1 01/10/2019   PLT 224.0 01/10/2019   Lab Results  Component Value Date   NA 141 01/10/2019   K 3.8 01/10/2019   CO2 29 01/10/2019   GLUCOSE 106 (H) 01/10/2019   BUN 18 01/10/2019   CREATININE 0.98 01/10/2019   BILITOT 0.4 01/10/2019   ALKPHOS 75 01/10/2019   AST 23 01/10/2019   ALT 28 01/10/2019   PROT 6.9 01/10/2019   ALBUMIN 4.8 01/10/2019   CALCIUM 9.5 01/10/2019   ANIONGAP 6 01/20/2016   GFR 78.28 01/10/2019   Lab Results  Component Value Date   CHOL 166 03/01/2018   Lab Results  Component Value Date   HDL 55.60 03/01/2018   Lab Results  Component Value Date   LDLCALC 89 03/01/2018   Lab Results  Component Value Date   TRIG 107.0 03/01/2018   Lab Results  Component Value Date   CHOLHDL 3 03/01/2018   No results found for: HGBA1C    Assessment & Plan:   #1 chest tightness.  This is nonexertional and relatively continuous.  He thinks this may be stress related.  EKG shows normal sinus rhythm with no acute ST-T changes. Discussed red flags for chest  discomfort such as exertional chest pain, associated dyspnea, radiation pattern such as to the neck or left arm, etc.  EKG reassuring.  #2 increased anxiety symptoms which may be accounting partly for #1.  Scored high on GAD-7 item scale with a score of 16.  We also discussed the fact this may be accounting for some of his headaches as well. -We discussed nonpharmacologic management with things like adequate sleep and exercise but at this point he has very little time with his responsibilities. -We discussed only very short-term trial of lorazepam 0.5 mg every 8 hours to see if this helps with his chest symptoms.  Consider possible SSRI if he gets any relief at all with the lorazepam short-term  #3 somewhat atypical headaches.  He has had previous neurologic evaluation.  We talked about importance of stress management and if headaches not improving over the next couple weeks consider follow-up with his neurologist.  He previously saw headache specialist [facility]  He will be setting up complete physical and reassess symptoms then.   Meds ordered this encounter  Medications  . LORazepam (ATIVAN) 0.5 MG tablet    Sig: Take 1 tablet (0.5 mg total) by mouth every 8 (eight) hours as needed for anxiety.    Dispense:  30 tablet    Refill:  0    Follow-up: No follow-ups on file.    Evelena Peat, MD

## 2020-03-18 ENCOUNTER — Encounter: Payer: Self-pay | Admitting: Family Medicine

## 2020-03-21 ENCOUNTER — Other Ambulatory Visit: Payer: Self-pay | Admitting: Family Medicine

## 2020-03-22 MED ORDER — LORAZEPAM 0.5 MG PO TABS: 0.5000 mg | ORAL_TABLET | Freq: Three times a day (TID) | ORAL | 0 refills | Status: DC | PRN

## 2020-03-22 NOTE — Telephone Encounter (Signed)
Refilled once, but avoid regular use.  If anxiety symptoms not improving, recommend we consider trial of SSRI, as discussed.

## 2020-03-25 ENCOUNTER — Other Ambulatory Visit: Payer: Self-pay | Admitting: Family Medicine

## 2020-04-04 ENCOUNTER — Other Ambulatory Visit: Payer: Self-pay

## 2020-04-05 ENCOUNTER — Ambulatory Visit (INDEPENDENT_AMBULATORY_CARE_PROVIDER_SITE_OTHER): Payer: BC Managed Care – PPO | Admitting: Family Medicine

## 2020-04-05 ENCOUNTER — Encounter: Payer: Self-pay | Admitting: Family Medicine

## 2020-04-05 VITALS — BP 112/80 | HR 84 | Ht 67.0 in | Wt 177.0 lb

## 2020-04-05 DIAGNOSIS — Z0000 Encounter for general adult medical examination without abnormal findings: Secondary | ICD-10-CM

## 2020-04-05 DIAGNOSIS — Z23 Encounter for immunization: Secondary | ICD-10-CM | POA: Diagnosis not present

## 2020-04-05 LAB — HEPATIC FUNCTION PANEL
ALT: 23 U/L (ref 0–53)
AST: 17 U/L (ref 0–37)
Albumin: 4.6 g/dL (ref 3.5–5.2)
Alkaline Phosphatase: 67 U/L (ref 39–117)
Bilirubin, Direct: 0.2 mg/dL (ref 0.0–0.3)
Total Bilirubin: 0.8 mg/dL (ref 0.2–1.2)
Total Protein: 6.8 g/dL (ref 6.0–8.3)

## 2020-04-05 LAB — CBC WITH DIFFERENTIAL/PLATELET
Basophils Absolute: 0 10*3/uL (ref 0.0–0.1)
Basophils Relative: 0.9 % (ref 0.0–3.0)
Eosinophils Absolute: 0.1 10*3/uL (ref 0.0–0.7)
Eosinophils Relative: 2.1 % (ref 0.0–5.0)
HCT: 45.8 % (ref 39.0–52.0)
Hemoglobin: 15.7 g/dL (ref 13.0–17.0)
Lymphocytes Relative: 18.8 % (ref 12.0–46.0)
Lymphs Abs: 0.8 10*3/uL (ref 0.7–4.0)
MCHC: 34.3 g/dL (ref 30.0–36.0)
MCV: 85.6 fl (ref 78.0–100.0)
Monocytes Absolute: 0.5 10*3/uL (ref 0.1–1.0)
Monocytes Relative: 10.3 % (ref 3.0–12.0)
Neutro Abs: 3 10*3/uL (ref 1.4–7.7)
Neutrophils Relative %: 67.9 % (ref 43.0–77.0)
Platelets: 213 10*3/uL (ref 150.0–400.0)
RBC: 5.36 Mil/uL (ref 4.22–5.81)
RDW: 13.1 % (ref 11.5–15.5)
WBC: 4.5 10*3/uL (ref 4.0–10.5)

## 2020-04-05 LAB — BASIC METABOLIC PANEL
BUN: 17 mg/dL (ref 6–23)
CO2: 28 mEq/L (ref 19–32)
Calcium: 9.6 mg/dL (ref 8.4–10.5)
Chloride: 105 mEq/L (ref 96–112)
Creatinine, Ser: 1.04 mg/dL (ref 0.40–1.50)
GFR: 78.19 mL/min (ref >60.00)
Glucose, Bld: 80 mg/dL (ref 70–99)
Potassium: 4.4 mEq/L (ref 3.5–5.1)
Sodium: 141 mEq/L (ref 135–145)

## 2020-04-05 LAB — LIPID PANEL
Cholesterol: 180 mg/dL (ref 0–200)
HDL: 62.5 mg/dL (ref >39.00)
LDL Cholesterol: 102 mg/dL — ABNORMAL HIGH (ref 0–99)
NonHDL: 117.97
Total CHOL/HDL Ratio: 3
Triglycerides: 79 mg/dL (ref 0.0–149.0)
VLDL: 15.8 mg/dL (ref 0.0–40.0)

## 2020-04-05 LAB — PSA: PSA: 0.87 ng/mL (ref 0.10–4.00)

## 2020-04-05 LAB — TSH: TSH: 1.98 u[IU]/mL (ref 0.35–4.50)

## 2020-04-05 MED ORDER — TADALAFIL 5 MG PO TABS: 5.0000 mg | ORAL_TABLET | Freq: Every day | ORAL | 3 refills | Status: DC

## 2020-04-05 NOTE — Patient Instructions (Signed)

## 2020-04-05 NOTE — Addendum Note (Signed)
Addended by: Tessie Fass D on: 04/05/2020 08:01 AM   Modules accepted: Orders

## 2020-04-05 NOTE — Progress Notes (Signed)
Established Patient Office Visit  Subjective:  Patient ID: Donald Clark, male    DOB: Mar 06, 1960  Age: 60 y.o. MRN: [id number]  CC:  Chief Complaint  Patient presents with  . Annual Exam    HPI Donald Clark presents for complete physical.    History of hypertension, GERD, kidney stones, and depression.  He has some recent atypical chest pains which have resolved and did seem to improve with lorazepam.  He had increased stress and anxiety issues which seem to be improved at this time.  He does have history of recurrent depression and is on Wellbutrin.  He does not feel we need to add any additional medications at this point.  He has had some nocturia in the past and noted when he took Cialis that he had less nocturia.  He now takes Cialis 5 mg daily.  Requesting refills.  Health Maintenance reviewed:    Health Maintenance  Topic Date Due  . COVID-19 Vaccine (1) Never done  . HIV Screening  Never done  . TETANUS/TDAP  11/14/2019  . COLONOSCOPY (Pts 45-50yrs Insurance coverage will need to be confirmed)  08/02/2023  . INFLUENZA VACCINE  Completed  . Hepatitis C Screening  Completed   Family history-both parents had colon cancer in the early 70s.  Mother had hypertension history.  He had a brother with some type of rare renal cancer.  He has 2 grandparents with history of type 2 diabetes.  Social history-he has a daughter getting ready to graduate from [employer].  Son who is Facilities manager at [employer].  He is married.  Helping raise his 35-year-old and 10-year-old great nieces.  Quit smoking 1982  Past Medical History:  Diagnosis Date  . Allergy   . Anxiety   . Asthma   . Cancer ([place])    skin- multiple removed-1 basal cell, 1 squamous cell  . Chicken pox   . Chronic kidney disease    stones  . Depression   . GERD (gastroesophageal reflux disease)   . Hypertension   . Prostatitis     Past Surgical History:  Procedure Laterality Date  . COLONOSCOPY    .  EXTRACORPOREAL SHOCK WAVE LITHOTRIPSY Right 03/29/2017   Procedure: RIGHT EXTRACORPOREAL SHOCK WAVE LITHOTRIPSY (ESWL);  Surgeon: Nickie Retort, MD;  Location: [facility];  Service: Urology;  Laterality: Right;  . LITHOTRIPSY    . skin cancer removed     on ankle, back neck  . UPPER GASTROINTESTINAL ENDOSCOPY      Family History  Problem Relation Age of Onset  . Cancer Mother        colon, uterine CA  . Hypertension Mother   . Colon cancer Mother   . Cancer Father 2       colon cancer  . Rectal cancer Father   . Colon cancer Father   . Diabetes Maternal Grandfather   . Diabetes Paternal Grandmother   . Esophageal cancer Neg Hx   . Stomach cancer Neg Hx   . Ulcerative colitis Neg Hx     Social History   Socioeconomic History  . Marital status: Married    Spouse name: Not on file  . Number of children: Not on file  . Years of education: Not on file  . Highest education level: Not on file  Occupational History  . Not on file  Tobacco Use  . Smoking status: Former Smoker    Packs/day: 0.50    Years: 4.00  Pack years: 2.00    Types: Cigarettes    Quit date: 10/13/1980    Years since quitting: 39.5  . Smokeless tobacco: Never Used  Vaping Use  . Vaping Use: Never used  Substance and Sexual Activity  . Alcohol use: Yes    Alcohol/week: 0.0 standard drinks    Comment: occ  . Drug use: No  . Sexual activity: Yes  Other Topics Concern  . Not on file  Social History Narrative  . Not on file   Social Determinants of Health   Financial Resource Strain: Not on file  Food Insecurity: Not on file  Transportation Needs: Not on file  Physical Activity: Not on file  Stress: Not on file  Social Connections: Not on file  Intimate Partner Violence: Not on file    Outpatient Medications Prior to Visit  Medication Sig Dispense Refill  . amLODipine (NORVASC) 10 MG tablet TAKE 1 TABLET BY MOUTH EVERY DAY 90 tablet 0  . buPROPion (WELLBUTRIN XL) 300 MG 24 hr tablet TAKE  1 TABLET BY MOUTH EVERY DAY 90 tablet 0  . carisoprodol (SOMA) 350 MG tablet Take 1 tablet (350 mg total) by mouth 4 (four) times daily as needed for muscle spasms. 30 tablet 0  . ibuprofen (ADVIL,MOTRIN) 200 MG tablet Take 400 mg by mouth every 6 (six) hours as needed for moderate pain.    Marland Kitchen loratadine (CLARITIN) 10 MG tablet Take 10 mg by mouth daily as needed for allergies.     Marland Kitchen LORazepam (ATIVAN) 0.5 MG tablet Take 1 tablet (0.5 mg total) by mouth every 8 (eight) hours as needed for anxiety. 20 tablet 0  . Multiple Vitamin (MULTIVITAMIN WITH MINERALS) TABS Take 1 tablet by mouth daily. Reported on 06/12/2015    . OVER THE COUNTER MEDICATION Zinc 50 mg One tablet daily.    Marland Kitchen OVER THE COUNTER MEDICATION Selenium, 200 mcg. One tablet daily.    . Tadalafil (CIALIS PO) Take by mouth.     No facility-administered medications prior to visit.    Allergies  Allergen Reactions  . Penicillins     GI upset Has patient had a PCN reaction causing immediate rash, facial/tongue/throat swelling, SOB or lightheadedness with hypotension: No Has patient had a PCN reaction causing severe rash involving mucus membranes or skin necrosis: No Has patient had a PCN reaction that required hospitalization no Has patient had a PCN reaction occurring within the last 10 years: Yes If all of the above answers are "NO", then may proceed with Cephalosporin use.   . Sulfa Antibiotics     Hives, joint pain, rash    ROS Review of Systems  Constitutional: Negative for activity change, appetite change, fatigue and fever.  HENT: Negative for congestion, ear pain and trouble swallowing.   Eyes: Negative for pain and visual disturbance.  Respiratory: Negative for cough, shortness of breath and wheezing.   Cardiovascular: Negative for chest pain and palpitations.  Gastrointestinal: Negative for abdominal distention, abdominal pain, blood in stool, constipation, diarrhea, nausea, rectal pain and vomiting.  Endocrine:  Negative for polydipsia and polyuria.  Genitourinary: Negative for dysuria, hematuria and testicular pain.  Musculoskeletal: Negative for arthralgias and joint swelling.  Skin: Negative for rash.  Neurological: Negative for dizziness, syncope and headaches.  Hematological: Negative for adenopathy.  Psychiatric/Behavioral: Negative for confusion and dysphoric mood.      Objective:    Physical Exam Constitutional:      General: He is not in acute distress.    Appearance:  He is well-developed and well-nourished.  HENT:     Head: Normocephalic and atraumatic.     Right Ear: External ear normal.     Left Ear: External ear normal.     Mouth/Throat:     Mouth: Oropharynx is clear and moist.  Eyes:     Extraocular Movements: EOM normal.     Conjunctiva/sclera: Conjunctivae normal.     Pupils: Pupils are equal, round, and reactive to light.  Neck:     Thyroid: No thyromegaly.  Cardiovascular:     Rate and Rhythm: Normal rate and regular rhythm.     Heart sounds: Normal heart sounds. No murmur heard.   Pulmonary:     Effort: No respiratory distress.     Breath sounds: No wheezing or rales.  Abdominal:     General: Bowel sounds are normal. There is no distension.     Palpations: Abdomen is soft. There is no mass.     Tenderness: There is no abdominal tenderness. There is no guarding or rebound.  Musculoskeletal:        General: No edema.     Cervical back: Normal range of motion and neck supple.  Lymphadenopathy:     Cervical: No cervical adenopathy.  Skin:    Findings: No rash.     Comments: He has some scattered benign-appearing seborrheic keratoses on his trunk along with some scattered benign angiomas.  Neurological:     Mental Status: He is alert and oriented to person, place, and time.     Cranial Nerves: No cranial nerve deficit.     Deep Tendon Reflexes: Reflexes normal.  Psychiatric:        Mood and Affect: Mood and affect normal.     There were no vitals taken  for this visit. Wt Readings from Last 3 Encounters:  03/05/20 174 lb (78.9 kg)  01/10/19 183 lb 14.4 oz (83.4 kg)  12/29/18 185 lb 6.4 oz (84.1 kg)     Health Maintenance Due  Topic Date Due  . COVID-19 Vaccine (1) Never done  . HIV Screening  Never done  . TETANUS/TDAP  11/14/2019    There are no preventive care reminders to display for this patient.  Lab Results  Component Value Date   TSH 1.63 03/01/2018   Lab Results  Component Value Date   WBC 6.9 01/10/2019   HGB 15.4 01/10/2019   HCT 44.5 01/10/2019   MCV 86.1 01/10/2019   PLT 224.0 01/10/2019   Lab Results  Component Value Date   NA 141 01/10/2019   K 3.8 01/10/2019   CO2 29 01/10/2019   GLUCOSE 106 (H) 01/10/2019   BUN 18 01/10/2019   CREATININE 0.98 01/10/2019   BILITOT 0.4 01/10/2019   ALKPHOS 75 01/10/2019   AST 23 01/10/2019   ALT 28 01/10/2019   PROT 6.9 01/10/2019   ALBUMIN 4.8 01/10/2019   CALCIUM 9.5 01/10/2019   ANIONGAP 6 01/20/2016   GFR 78.28 01/10/2019   Lab Results  Component Value Date   CHOL 166 03/01/2018   Lab Results  Component Value Date   HDL 55.60 03/01/2018   Lab Results  Component Value Date   LDLCALC 89 03/01/2018   Lab Results  Component Value Date   TRIG 107.0 03/01/2018   Lab Results  Component Value Date   CHOLHDL 3 03/01/2018   No results found for: HGBA1C    Assessment & Plan:   Physical exam.  Generally healthy 60 year old male.  He has hypertension which  is well controlled on amlodipine.  Depression currently stable on Wellbutrin.  We discussed the following health maintenance issues  -Tetanus booster given -Consider Shingrix vaccine -Obtain screening labs -Refilled Cialis for 1 year  No orders of the defined types were placed in this encounter.   Follow-up: No follow-ups on file.    Carolann Littler, MD

## 2020-04-05 NOTE — Addendum Note (Signed)
Addended by: Amado Coe on: 04/05/2020 08:11 AM   Modules accepted: Orders

## 2020-06-20 ENCOUNTER — Other Ambulatory Visit: Payer: Self-pay | Admitting: Family Medicine

## 2020-06-21 ENCOUNTER — Other Ambulatory Visit: Payer: Self-pay | Admitting: Family Medicine

## 2020-07-02 ENCOUNTER — Other Ambulatory Visit: Payer: Self-pay | Admitting: Urology

## 2020-07-18 ENCOUNTER — Encounter (HOSPITAL_BASED_OUTPATIENT_CLINIC_OR_DEPARTMENT_OTHER): Payer: Self-pay | Admitting: Urology

## 2020-07-18 ENCOUNTER — Other Ambulatory Visit: Payer: Self-pay

## 2020-07-18 NOTE — Progress Notes (Signed)
Spoke w/ via phone for pre-op interview---pt I stat, ekg            Lab results------echo 09-05-2012 epic COVID test -----patient states asymptomatic no test needed Arrive at -------630 am 07-24-2020 NPO after MN NO Solid Food.  Clear liquids from MN until---530 am then npo Med rec completed Medications to take morning of surgery -----usual morning meds with sips of water Diabetic medication -----n/a Patient instructed to bring photo id and insurance card day of surgery Patient aware to have Driver (ride ) / caregiver  Pt to driver self dos, pt wife Donald Clark to pick pt up   for 24 hours after surgery  Patient Special Instructions -----none Pre-Op special Istructions -----none Patient verbalized understanding of instructions that were given at this phone interview. Patient denies shortness of breath, chest pain, fever, cough at this phone interview.

## 2020-07-23 NOTE — Anesthesia Preprocedure Evaluation (Addendum)
Anesthesia Evaluation  Patient identified by MRN, date of birth, ID band Patient awake    Reviewed: Allergy & Precautions, NPO status , Patient's Chart, lab work & pertinent test results  History of Anesthesia Complications Negative for: history of anesthetic complications  Airway Mallampati: II  TM Distance: >3 FB Neck ROM: Full    Dental  (+) Caps   Pulmonary asthma , former smoker,    Pulmonary exam normal        Cardiovascular hypertension, Pt. on medications Normal cardiovascular exam     Neuro/Psych Anxiety Depression negative neurological ROS     GI/Hepatic Neg liver ROS, GERD  Medicated and Controlled,  Endo/Other  negative endocrine ROS  Renal/GU negative Renal ROS  negative genitourinary   Musculoskeletal  (+) Arthritis ,   Abdominal   Peds  Hematology negative hematology ROS (+)   Anesthesia Other Findings Day of surgery medications reviewed with patient.  Reproductive/Obstetrics negative OB ROS                            Anesthesia Physical Anesthesia Plan  ASA: II  Anesthesia Plan: General   Post-op Pain Management:    Induction: Intravenous  PONV Risk Score and Plan: 2 and Treatment may vary due to age or medical condition, Ondansetron, Dexamethasone and Midazolam  Airway Management Planned: LMA  Additional Equipment: None  Intra-op Plan:   Post-operative Plan: Extubation in OR  Informed Consent: I have reviewed the patients History and Physical, chart, labs and discussed the procedure including the risks, benefits and alternatives for the proposed anesthesia with the patient or authorized representative who has indicated his/her understanding and acceptance.     Dental advisory given  Plan Discussed with: CRNA  Anesthesia Plan Comments:        Anesthesia Quick Evaluation

## 2020-07-24 ENCOUNTER — Ambulatory Visit (HOSPITAL_BASED_OUTPATIENT_CLINIC_OR_DEPARTMENT_OTHER)
Admission: RE | Admit: 2020-07-24 | Discharge: 2020-07-24 | Disposition: A | Discharge status: Home / Self Care | Payer: BC Managed Care – PPO | Source: Ambulatory Visit | Attending: Urology | Admitting: Urology

## 2020-07-24 ENCOUNTER — Ambulatory Visit (HOSPITAL_BASED_OUTPATIENT_CLINIC_OR_DEPARTMENT_OTHER): Payer: BC Managed Care – PPO | Admitting: Anesthesiology

## 2020-07-24 ENCOUNTER — Encounter (HOSPITAL_BASED_OUTPATIENT_CLINIC_OR_DEPARTMENT_OTHER): Payer: Self-pay | Admitting: Urology

## 2020-07-24 ENCOUNTER — Encounter (HOSPITAL_BASED_OUTPATIENT_CLINIC_OR_DEPARTMENT_OTHER)
Admission: RE | Disposition: A | Discharge status: Home / Self Care | Payer: Self-pay | Source: Ambulatory Visit | Attending: Urology

## 2020-07-24 ENCOUNTER — Other Ambulatory Visit: Payer: Self-pay

## 2020-07-24 DIAGNOSIS — Z791 Long term (current) use of non-steroidal anti-inflammatories (NSAID): Secondary | ICD-10-CM | POA: Diagnosis not present

## 2020-07-24 DIAGNOSIS — N433 Hydrocele, unspecified: Secondary | ICD-10-CM | POA: Diagnosis not present

## 2020-07-24 DIAGNOSIS — Z87891 Personal history of nicotine dependence: Secondary | ICD-10-CM | POA: Diagnosis not present

## 2020-07-24 DIAGNOSIS — K219 Gastro-esophageal reflux disease without esophagitis: Secondary | ICD-10-CM | POA: Diagnosis not present

## 2020-07-24 DIAGNOSIS — Z833 Family history of diabetes mellitus: Secondary | ICD-10-CM | POA: Diagnosis not present

## 2020-07-24 DIAGNOSIS — Z8049 Family history of malignant neoplasm of other genital organs: Secondary | ICD-10-CM | POA: Diagnosis not present

## 2020-07-24 DIAGNOSIS — Z882 Allergy status to sulfonamides status: Secondary | ICD-10-CM | POA: Insufficient documentation

## 2020-07-24 DIAGNOSIS — Z8249 Family history of ischemic heart disease and other diseases of the circulatory system: Secondary | ICD-10-CM | POA: Insufficient documentation

## 2020-07-24 DIAGNOSIS — Z880 Allergy status to penicillin: Secondary | ICD-10-CM | POA: Insufficient documentation

## 2020-07-24 DIAGNOSIS — N200 Calculus of kidney: Secondary | ICD-10-CM | POA: Insufficient documentation

## 2020-07-24 DIAGNOSIS — Z800 Family history of malignant neoplasm of digestive organs: Secondary | ICD-10-CM | POA: Diagnosis not present

## 2020-07-24 DIAGNOSIS — Z87442 Personal history of urinary calculi: Secondary | ICD-10-CM | POA: Diagnosis not present

## 2020-07-24 DIAGNOSIS — I10 Essential (primary) hypertension: Secondary | ICD-10-CM | POA: Insufficient documentation

## 2020-07-24 DIAGNOSIS — Z8051 Family history of malignant neoplasm of kidney: Secondary | ICD-10-CM | POA: Diagnosis not present

## 2020-07-24 DIAGNOSIS — Z79899 Other long term (current) drug therapy: Secondary | ICD-10-CM | POA: Diagnosis not present

## 2020-07-24 HISTORY — DX: Other specified disorders involving the immune mechanism, not elsewhere classified: D89.89

## 2020-07-24 HISTORY — PX: HYDROCELE EXCISION: SHX482

## 2020-07-24 HISTORY — PX: CYSTOSCOPY WITH RETROGRADE PYELOGRAM, URETEROSCOPY AND STENT PLACEMENT: SHX5789

## 2020-07-24 HISTORY — DX: Headache, unspecified: R51.9

## 2020-07-24 HISTORY — PX: HOLMIUM LASER APPLICATION: SHX5852

## 2020-07-24 HISTORY — DX: Presence of spectacles and contact lenses: Z97.3

## 2020-07-24 HISTORY — DX: Personal history of urinary calculi: Z87.442

## 2020-07-24 LAB — POCT I-STAT, CHEM 8
BUN: 16 mg/dL (ref 6–20)
Calcium, Ion: 1.27 mmol/L (ref 1.15–1.40)
Chloride: 104 mmol/L (ref 98–111)
Creatinine, Ser: 0.9 mg/dL (ref 0.61–1.24)
Glucose, Bld: 96 mg/dL (ref 70–99)
HCT: 46 % (ref 39.0–52.0)
Hemoglobin: 15.6 g/dL (ref 13.0–17.0)
Potassium: 3.7 mmol/L (ref 3.5–5.1)
Sodium: 143 mmol/L (ref 135–145)
TCO2: 25 mmol/L (ref 22–32)

## 2020-07-24 SURGERY — CYSTOURETEROSCOPY, WITH RETROGRADE PYELOGRAM AND STENT INSERTION
Anesthesia: General | Site: Ureter | Laterality: Left

## 2020-07-24 MED ORDER — FENTANYL CITRATE (PF) 100 MCG/2ML IJ SOLN
INTRAMUSCULAR | Status: AC
  Filled 2020-07-24: qty 2

## 2020-07-24 MED ORDER — ONDANSETRON HCL 4 MG/2ML IJ SOLN
INTRAMUSCULAR | Status: DC | PRN
  Administered 2020-07-24: 4 mg via INTRAVENOUS

## 2020-07-24 MED ORDER — PROPOFOL 10 MG/ML IV BOLUS
INTRAVENOUS | Status: DC | PRN
  Administered 2020-07-24: 40 mg via INTRAVENOUS
  Administered 2020-07-24: 200 mg via INTRAVENOUS

## 2020-07-24 MED ORDER — KETOROLAC TROMETHAMINE 30 MG/ML IJ SOLN
INTRAMUSCULAR | Status: DC | PRN
  Administered 2020-07-24: 30 mg via INTRAVENOUS

## 2020-07-24 MED ORDER — PROMETHAZINE HCL 25 MG/ML IJ SOLN: 6.2500 mg | INTRAMUSCULAR | Status: DC | PRN

## 2020-07-24 MED ORDER — ONDANSETRON HCL 4 MG/2ML IJ SOLN
INTRAMUSCULAR | Status: AC
  Filled 2020-07-24: qty 2

## 2020-07-24 MED ORDER — DEXAMETHASONE SODIUM PHOSPHATE 10 MG/ML IJ SOLN
INTRAMUSCULAR | Status: DC | PRN
  Administered 2020-07-24: 8 mg via INTRAVENOUS

## 2020-07-24 MED ORDER — PROPOFOL 500 MG/50ML IV EMUL
INTRAVENOUS | Status: AC
  Filled 2020-07-24: qty 50

## 2020-07-24 MED ORDER — FENTANYL CITRATE (PF) 100 MCG/2ML IJ SOLN
INTRAMUSCULAR | Status: DC | PRN
  Administered 2020-07-24 (×4): 50 ug via INTRAVENOUS

## 2020-07-24 MED ORDER — OXYCODONE HCL 5 MG PO TABS
5.0000 mg | ORAL_TABLET | Freq: Once | ORAL | Status: AC | PRN
Start: 2020-07-24 — End: 2020-07-24
  Administered 2020-07-24: 5 mg via ORAL

## 2020-07-24 MED ORDER — MIDAZOLAM HCL 5 MG/5ML IJ SOLN
INTRAMUSCULAR | Status: DC | PRN
  Administered 2020-07-24: 2 mg via INTRAVENOUS

## 2020-07-24 MED ORDER — ACETAMINOPHEN 500 MG PO TABS
1000.0000 mg | ORAL_TABLET | Freq: Once | ORAL | Status: AC
  Administered 2020-07-24: 1000 mg via ORAL

## 2020-07-24 MED ORDER — OXYCODONE-ACETAMINOPHEN 5-325 MG PO TABS: 1.0000 | ORAL_TABLET | Freq: Four times a day (QID) | ORAL | 0 refills | Status: DC | PRN

## 2020-07-24 MED ORDER — LIDOCAINE 2% (20 MG/ML) 5 ML SYRINGE
INTRAMUSCULAR | Status: DC | PRN
  Administered 2020-07-24: 100 mg via INTRAVENOUS

## 2020-07-24 MED ORDER — FENTANYL CITRATE (PF) 100 MCG/2ML IJ SOLN: 25.0000 ug | INTRAMUSCULAR | Status: DC | PRN

## 2020-07-24 MED ORDER — OXYCODONE HCL 5 MG/5ML PO SOLN
5.0000 mg | Freq: Once | ORAL | Status: AC | PRN
Start: 2020-07-24 — End: 2020-07-24

## 2020-07-24 MED ORDER — CEFAZOLIN SODIUM-DEXTROSE 2-4 GM/100ML-% IV SOLN
INTRAVENOUS | Status: AC
  Filled 2020-07-24: qty 100

## 2020-07-24 MED ORDER — LACTATED RINGERS IV SOLN: INTRAVENOUS | Status: DC

## 2020-07-24 MED ORDER — BUPIVACAINE HCL (PF) 0.25 % IJ SOLN
INTRAMUSCULAR | Status: DC | PRN
  Administered 2020-07-24: 20 mL

## 2020-07-24 MED ORDER — IOHEXOL 300 MG/ML  SOLN
INTRAMUSCULAR | Status: DC | PRN
  Administered 2020-07-24: 20 mL via URETHRAL

## 2020-07-24 MED ORDER — MIDAZOLAM HCL 2 MG/2ML IJ SOLN
INTRAMUSCULAR | Status: AC
  Filled 2020-07-24: qty 2

## 2020-07-24 MED ORDER — SODIUM CHLORIDE 0.9 % IR SOLN
Status: DC | PRN
  Administered 2020-07-24: 6000 mL

## 2020-07-24 MED ORDER — SCOPOLAMINE 1 MG/3DAYS TD PT72
MEDICATED_PATCH | TRANSDERMAL | Status: AC
  Filled 2020-07-24: qty 1

## 2020-07-24 MED ORDER — SCOPOLAMINE 1 MG/3DAYS TD PT72: MEDICATED_PATCH | TRANSDERMAL | Status: DC | PRN

## 2020-07-24 MED ORDER — LIDOCAINE 2% (20 MG/ML) 5 ML SYRINGE
INTRAMUSCULAR | Status: AC
  Filled 2020-07-24: qty 5

## 2020-07-24 MED ORDER — KETOROLAC TROMETHAMINE 10 MG PO TABS: 10.0000 mg | ORAL_TABLET | Freq: Three times a day (TID) | ORAL | 0 refills | Status: DC | PRN

## 2020-07-24 MED ORDER — CEFAZOLIN SODIUM-DEXTROSE 2-4 GM/100ML-% IV SOLN
2.0000 g | Freq: Once | INTRAVENOUS | Status: AC
  Administered 2020-07-24: 2 g via INTRAVENOUS

## 2020-07-24 MED ORDER — OXYCODONE HCL 5 MG PO TABS
ORAL_TABLET | ORAL | Status: AC
  Filled 2020-07-24: qty 1

## 2020-07-24 MED ORDER — ACETAMINOPHEN 500 MG PO TABS
ORAL_TABLET | ORAL | Status: AC
  Filled 2020-07-24: qty 2

## 2020-07-24 MED ORDER — SENNOSIDES-DOCUSATE SODIUM 8.6-50 MG PO TABS: 1.0000 | ORAL_TABLET | Freq: Two times a day (BID) | ORAL | 0 refills | Status: DC

## 2020-07-24 MED ORDER — DEXAMETHASONE SODIUM PHOSPHATE 10 MG/ML IJ SOLN
INTRAMUSCULAR | Status: AC
  Filled 2020-07-24: qty 1

## 2020-07-24 MED ORDER — SCOPOLAMINE 1 MG/3DAYS TD PT72
MEDICATED_PATCH | TRANSDERMAL | Status: DC | PRN
  Administered 2020-07-24: 1 via TRANSDERMAL

## 2020-07-24 SURGICAL SUPPLY — 65 items
ADH SKN CLS APL DERMABOND .7 (GAUZE/BANDAGES/DRESSINGS)
BAG DRAIN URO-CYSTO SKYTR STRL (DRAIN) ×3 IMPLANT
BAG DRN UROCATH (DRAIN) ×2
BASKET LASER NITINOL 1.9FR (BASKET) ×3 IMPLANT
BLADE CLIPPER SENSICLIP SURGIC (BLADE) ×3 IMPLANT
BLADE HEX COATED 2.75 (ELECTRODE) ×3 IMPLANT
BLADE SURG 15 STRL LF DISP TIS (BLADE) ×2 IMPLANT
BLADE SURG 15 STRL SS (BLADE) ×3
BNDG GAUZE ELAST 4 BULKY (GAUZE/BANDAGES/DRESSINGS) ×3 IMPLANT
BRIEF STRETCH FOR OB PAD LRG (UNDERPADS AND DIAPERS) ×3 IMPLANT
BSKT STON RTRVL 120 1.9FR (BASKET) ×2
CATH INTERMIT  6FR 70CM (CATHETERS) ×3 IMPLANT
CLOTH BEACON ORANGE TIMEOUT ST (SAFETY) ×3 IMPLANT
COVER BACK TABLE 60X90IN (DRAPES) ×3 IMPLANT
COVER DOME SNAP 22 D (MISCELLANEOUS) ×3 IMPLANT
COVER MAYO STAND STRL (DRAPES) ×3 IMPLANT
COVER WAND RF STERILE (DRAPES) ×3 IMPLANT
DERMABOND ADVANCED (GAUZE/BANDAGES/DRESSINGS)
DERMABOND ADVANCED .7 DNX12 (GAUZE/BANDAGES/DRESSINGS) IMPLANT
DISSECTOR ROUND CHERRY 3/8 STR (MISCELLANEOUS) IMPLANT
DRAIN PENROSE 0.25X18 (DRAIN) ×3 IMPLANT
DRAPE LAPAROTOMY 100X72 PEDS (DRAPES) ×3 IMPLANT
DRSG PAD ABDOMINAL 8X10 ST (GAUZE/BANDAGES/DRESSINGS) ×3 IMPLANT
DRSG TEGADERM 4X4.75 (GAUZE/BANDAGES/DRESSINGS) IMPLANT
ELECT REM PT RETURN 9FT ADLT (ELECTROSURGICAL) ×3
ELECTRODE REM PT RTRN 9FT ADLT (ELECTROSURGICAL) ×2 IMPLANT
FIBER LASER FLEXIVA 365 (UROLOGICAL SUPPLIES) IMPLANT
GLOVE SURG ENC MOIS LTX SZ7.5 (GLOVE) ×3 IMPLANT
GOWN STRL REUS W/TWL LRG LVL3 (GOWN DISPOSABLE) ×3 IMPLANT
GUIDEWIRE ANG ZIPWIRE 038X150 (WIRE) ×3 IMPLANT
GUIDEWIRE STR DUAL SENSOR (WIRE) ×6 IMPLANT
INFUSOR MANOMETER BAG 3000ML (MISCELLANEOUS) ×6 IMPLANT
IV NS 1000ML (IV SOLUTION) ×3
IV NS 1000ML BAXH (IV SOLUTION) ×2 IMPLANT
IV NS IRRIG 3000ML ARTHROMATIC (IV SOLUTION) ×9 IMPLANT
KIT TURNOVER CYSTO (KITS) ×3 IMPLANT
MANIFOLD NEPTUNE II (INSTRUMENTS) ×3 IMPLANT
NEEDLE HYPO 25X1 1.5 SAFETY (NEEDLE) ×3 IMPLANT
NS IRRIG 500ML POUR BTL (IV SOLUTION) ×3 IMPLANT
PACK BASIN DAY SURGERY FS (CUSTOM PROCEDURE TRAY) ×3 IMPLANT
PACK CYSTO (CUSTOM PROCEDURE TRAY) ×3 IMPLANT
PENCIL BUTTON HOLSTER BLD 10FT (ELECTRODE) ×3 IMPLANT
PENCIL SMOKE EVACUATOR (MISCELLANEOUS) ×3 IMPLANT
SHEATH URETERAL 12FRX35CM (MISCELLANEOUS) ×3 IMPLANT
STENT POLARIS 5FRX26 (STENTS) ×6 IMPLANT
SUT ETHILON 4 0 PS 2 18 (SUTURE) ×3 IMPLANT
SUT MNCRL AB 4-0 PS2 18 (SUTURE) ×3 IMPLANT
SUT VIC AB 2-0 SH 27 (SUTURE)
SUT VIC AB 2-0 SH 27XBRD (SUTURE) IMPLANT
SUT VIC AB 3-0 SH 27 (SUTURE) ×6
SUT VIC AB 3-0 SH 27X BRD (SUTURE) IMPLANT
SUT VIC AB 3-0 SH 27XBRD (SUTURE) ×4 IMPLANT
SUT VIC AB 4-0 SH 27 (SUTURE) ×3
SUT VIC AB 4-0 SH 27XANBCTRL (SUTURE) ×2 IMPLANT
SYR 10ML LL (SYRINGE) ×3 IMPLANT
SYR CONTROL 10ML LL (SYRINGE) ×3 IMPLANT
TOWEL OR 17X26 10 PK STRL BLUE (TOWEL DISPOSABLE) ×3 IMPLANT
TRACTIP FLEXIVA PULS ID 200XHI (Laser) ×2 IMPLANT
TRACTIP FLEXIVA PULSE ID 200 (Laser) ×3
TRAY DSU PREP LF (CUSTOM PROCEDURE TRAY) ×6 IMPLANT
TUBE CONNECTING 12X1/4 (SUCTIONS) ×3 IMPLANT
TUBE FEEDING 8FR 16IN STR KANG (MISCELLANEOUS) ×3 IMPLANT
TUBING UROLOGY SET (TUBING) ×3 IMPLANT
WATER STERILE IRR 500ML POUR (IV SOLUTION) IMPLANT
YANKAUER SUCT BULB TIP NO VENT (SUCTIONS) ×3 IMPLANT

## 2020-07-24 NOTE — Discharge Instructions (Signed)
1 - You may have urinary urgency (bladder spasms) and bloody urine on / off with stent in place. There will also be some light pink drainage from scrotal drain. This is normal.  2 - You can shower immeidately. No tub bathing until drain removed.   3 -Call MD or go to ER for fever >102, severe pain / nausea / vomiting not relieved by medications, or acute change in medical status  Post Anesthesia Home Care Instructions  Activity: Get plenty of rest for the remainder of the day. A responsible adult should stay with you for 24 hours following the procedure.  For the next 24 hours, DO NOT: -Drive a car -Paediatric nurse -Drink alcoholic beverages -Take any medication unless instructed by your physician -Make any legal decisions or sign important papers.  Meals: Start with liquid foods such as gelatin or soup. Progress to regular foods as tolerated. Avoid greasy, spicy, heavy foods. If nausea and/or vomiting occur, drink only clear liquids until the nausea and/or vomiting subsides. Call your physician if vomiting continues.  Special Instructions/Symptoms: Your throat may feel dry or sore from the anesthesia or the breathing tube placed in your throat during surgery. If this causes discomfort, gargle with warm salt water. The discomfort should disappear within 24 hours.  If you had a scopolamine patch placed behind your ear for the management of post- operative nausea and/or vomiting:  1. The medication in the patch is effective for 72 hours, after which it should be removed.  Wrap patch in a tissue and discard in the trash. Wash hands thoroughly with soap and water. 2. You may remove the patch earlier than 72 hours if you experience unpleasant side effects which may include dry mouth, dizziness or visual disturbances. 3. Avoid touching the patch. Wash your hands with soap and water after contact with the patch.    HOME CARE INSTRUCTIONS FOR SCROTAL PROCEDURES  Wound Care & Hygiene: You  may apply an ice bag to the scrotum for the first 24 hours.  This may help decrease swelling and soreness.  You may have a dressing held in place by an athletic supporter.  You may remove the dressing in 24 hours and shower in 48 hours.  Continue to use the athletic supporter or tight briefs for at least a week. Activity: Rest today - not necessarily flat bed rest.  Just take it easy.  You should not do strenuous activities until your follow-up visit with your doctor.  You may resume light activity in 48 hours.  Return to Work:  Your doctor will advise you of this depending on the type of work you do  Diet: Drink liquids or eat a light diet this evening.  You may resume a regular diet tomorrow.  General Expectations: You may have a small amount of bleeding.  The scrotum may be swollen or bruised for about a week.  Call your Doctor if these occur:  -persistent or heavy bleeding  -temperature of 101 degrees or more  -severe pain, not relieved by your pain medication  Return to [employer]:  Call to set up and appointment.  Patient Signature:  __________________________________________________  Nurse's Signature:  __________________________________________________ [facility] [phone] Post Ureteroscopy With or Without Stent Instructions  Definitions:  Ureter: The duct that transports urine from the kidney to the bladder. Stent:   A plastic hollow tube that is placed into the ureter, from the kidney to the bladder to prevent the ureter from swelling shut.  GENERAL INSTRUCTIONS:  Despite the fact that no skin incisions were used, the area around the ureter and bladder is raw and irritated. The stent is a foreign body which will further irritate the bladder wall. This irritation is manifested by increased frequency of urination, both day and night, and by an increase in the urge to urinate. In some, the urge to urinate is present almost always. Sometimes the  urge is strong enough that you may not be able to stop yourself from urinating. The only real cure is to remove the stent and then give time for the bladder wall to heal which can't be done until the danger of the ureter swelling shut has passed, which varies.  You may see some blood in your urine while the stent is in place and a few days afterwards. Do not be alarmed, even if the urine was clear for a while. Get off your feet and drink lots of fluids until clearing occurs. If you start to pass clots or don't improve, call us.  DIET: You may return to your normal diet immediately. Because of the raw surface of your bladder, alcohol, spicy foods, acid type foods and drinks with caffeine may cause irritation or frequency and should be used in moderation. To keep your urine flowing freely and to avoid constipation, drink plenty of fluids during the day ( 8-10 glasses ). Tip: Avoid cranberry juice because it is very acidic.  ACTIVITY: Your physical activity doesn't need to be restricted. However, if you are very active, you may see some blood in your urine. We suggest that you reduce your activity under these circumstances until the bleeding has stopped.  BOWELS: It is important to keep your bowels regular during the postoperative period. Straining with bowel movements can cause bleeding. A bowel movement every other day is reasonable. Use a mild laxative if needed, such as Milk of Magnesia 2-3 tablespoons, or 2 Dulcolax tablets. Call if you continue to have problems. If you have been taking narcotics for pain, before, during or after your surgery, you may be constipated. Take a laxative if necessary.   MEDICATION: You should resume your pre-surgery medications unless told not to. In addition you will often be given an antibiotic to prevent infection. These should be taken as prescribed until the bottles are finished unless you are having an unusual reaction to one of the drugs.  PROBLEMS YOU SHOULD  REPORT TO [place]:  Fevers over 100.5 Fahrenheit.  Heavy bleeding, or clots ( See above notes about blood in urine ).  Inability to urinate.  Drug reactions ( hives, rash, nausea, vomiting, diarrhea ).  Severe burning or pain with urination that is not improving.  FOLLOW-UP: You will need a follow-up appointment to monitor your progress. Call for this appointment at the number listed above. Usually the first appointment will be about three to fourteen days after your surgery.

## 2020-07-24 NOTE — Anesthesia Postprocedure Evaluation (Signed)
Anesthesia Post Note  Patient: Donald Clark  Procedure(s) Performed: CYSTOSCOPY WITH RETROGRADE PYELOGRAM, URETEROSCOPY AND STENT PLACEMENT (Bilateral Ureter) HOLMIUM LASER APPLICATION (Bilateral Ureter) HYDROCELECTOMY ADULT (Left Scrotum)     Patient location during evaluation: PACU Anesthesia Type: General Level of consciousness: awake and alert and oriented Pain management: pain level controlled Vital Signs Assessment: post-procedure vital signs reviewed and stable Respiratory status: spontaneous breathing, nonlabored ventilation and respiratory function stable Cardiovascular status: blood pressure returned to baseline Postop Assessment: no apparent nausea or vomiting Anesthetic complications: no   No complications documented.  Last Vitals:  Vitals:   07/24/20 1110 07/24/20 1115  BP: 111/74 117/82  Pulse: 90 83  Resp: (!) 23 14  Temp: 36.5 C   SpO2: 96% 98%            [name]

## 2020-07-24 NOTE — H&P (Signed)
Donald Clark is an 60 y.o. male.    Chief Complaint: Pre-Op BILATERAL Ureteroscopy and Left Hydrocelectomy  HPI:   1 - Recurrent Urolithiasis -  Pre 2019 - SWL x several  03/2017 - SWL for ball-valving left UPJ sotne (renal at time of SWL, obstrucing 2 weeks prior)   Recent Surveillance:  04/2020 KUB - 6mm LLP, Rt upper 29mm x2   2 -Left Hydrocele - about 168ml Left hydrocele on exam 04/2017 that is increasing bother, may want surgery at some point.    PMH sig for headaches, anxiety/depression (wellbutrin + benzos). NO ischemic CV disease / blood thinners. His PCP is Donald Littler MD.   Today " Donald Clark " is seen to proceed with bilateral ureteroscopy and left hydrocelectomy.  No intervla fevers or colic.    Past Medical History:  Diagnosis Date  . Allergy   . Anxiety   . Asthma     exercise induced mild no inhaler use  . Autoimmune disorder ([place])    never dx occ causes joint and muscle weakness/soreness  . Cancer ([place])    skin- multiple removed-1 basal cell  2016, 1 squamous cell 2002  . Chicken pox as child  . Depression   . GERD (gastroesophageal reflux disease)   . Headache   . History of kidney stones   . History of migraine 2018   none since 2018  . Hypertension   . Prostatitis   . Shingles 2017   torso, no deficit from  . Wears glasses     Past Surgical History:  Procedure Laterality Date  . COLONOSCOPY  2019  . EXTRACORPOREAL SHOCK WAVE LITHOTRIPSY Right 03/29/2017   Procedure: RIGHT EXTRACORPOREAL SHOCK WAVE LITHOTRIPSY (ESWL);  Surgeon: Donald Retort, MD;  Location: [facility];  Service: Urology;  Laterality: Right;  . LITHOTRIPSY  last done jan 2019   x 6  . skin cancer removed     on ankle, back neck  . UPPER GASTROINTESTINAL ENDOSCOPY  not sure when    Family History  Problem Relation Age of Onset  . Cancer Mother        colon, uterine CA  . Hypertension Mother   . Colon cancer Mother   . Cancer Father 14       colon cancer  . Rectal cancer  Father   . Colon cancer Father   . Diabetes Maternal Grandfather   . Diabetes Paternal Grandmother   . Cancer Brother 50       kidney cancer  . Esophageal cancer Neg Hx   . Stomach cancer Neg Hx   . Ulcerative colitis Neg Hx    Social History:  reports that he quit smoking about 39 years ago. His smoking use included cigarettes. He has a 2.00 pack-year smoking history. He has never used smokeless tobacco. He reports current alcohol use. He reports that he does not use drugs.  Allergies:  Allergies  Allergen Reactions  . Penicillins     GI upset Has patient had a PCN reaction causing immediate rash, facial/tongue/throat swelling, SOB or lightheadedness with hypotension: No Has patient had a PCN reaction causing severe rash involving mucus membranes or skin necrosis: No Has patient had a PCN reaction that required hospitalization no Has patient had a PCN reaction occurring within the last 10 years: Yes If all of the above answers are "NO", then may proceed with Cephalosporin use.   . Sulfa Antibiotics     Hives, joint pain, rash    Medications  Prior to Admission  Medication Sig Dispense Refill  . amLODipine (NORVASC) 10 MG tablet TAKE 1 TABLET BY MOUTH EVERY DAY 90 tablet 0  . buPROPion (WELLBUTRIN XL) 300 MG 24 hr tablet TAKE 1 TABLET BY MOUTH EVERY DAY 90 tablet 0  . ibuprofen (ADVIL,MOTRIN) 200 MG tablet Take 400 mg by mouth every 6 (six) hours as needed for moderate pain.    Marland Kitchen lansoprazole (PREVACID SOLUTAB) 15 MG disintegrating tablet Take 15 mg by mouth daily at 12 noon.    . loratadine (CLARITIN) 10 MG tablet Take 10 mg by mouth daily.    . Multiple Vitamin (MULTIVITAMIN WITH MINERALS) TABS Take 1 tablet by mouth daily. Reported on 06/12/2015    . OVER THE COUNTER MEDICATION Zinc 50 mg One tablet daily.    Marland Kitchen OVER THE COUNTER MEDICATION Selenium, 200 mcg. One tablet daily.    . tadalafil (CIALIS) 5 MG tablet Take 1 tablet (5 mg total) by mouth daily. 90 tablet 3  . LORazepam  (ATIVAN) 0.5 MG tablet Take 1 tablet (0.5 mg total) by mouth every 8 (eight) hours as needed for anxiety. (Patient not taking: No sig reported) 20 tablet 0    Results for orders placed or performed during the hospital encounter of 07/24/20 (from the past 48 hour(s))  I-STAT, chem 8     Status: None   Collection Time: 07/24/20  7:02 AM  Result Value Ref Range   Sodium 143 135 - 145 mmol/L   Potassium 3.7 3.5 - 5.1 mmol/L   Chloride 104 98 - 111 mmol/L   BUN 16 6 - 20 mg/dL   Creatinine, Ser 0.90 0.61 - 1.24 mg/dL   Glucose, Bld 96 70 - 99 mg/dL    Comment: Glucose reference range applies only to samples taken after fasting for at least 8 hours.   Calcium, Ion 1.27 1.15 - 1.40 mmol/L   TCO2 25 22 - 32 mmol/L   Hemoglobin 15.6 13.0 - 17.0 g/dL   HCT 46.0 39.0 - 52.0 %   No results found.  Review of Systems  Blood pressure (!) 144/93, pulse 90, temperature 98.4 F (36.9 C), temperature source Oral, resp. rate 16, height 5\' 7"  (1.702 m), weight 82.1 kg, SpO2 96 %. Physical Exam   Assessment/Plan  Proceed as planned with cysto, BILATERAL ureteroscopic stone manipulation and LEFT hydrocelectomy. Risks, benefis, alternatives, expected peri-op course, need for temporary stents given bilateral procedure discussed previously and reiterated today.   Donald Frock, MD 07/24/2020, 8:04 AM

## 2020-07-24 NOTE — Brief Op Note (Signed)
07/24/2020  10:55 AM  PATIENT:  Donald Clark  60 y.o. male  PRE-OPERATIVE DIAGNOSIS:  BILATERAL RENAL STONES, LEFT HYDROCELE  POST-OPERATIVE DIAGNOSIS:  BILATERAL RENAL STONES, LEFT HYDROCELE  PROCEDURE:  Procedure(s) with comments: CYSTOSCOPY WITH RETROGRADE PYELOGRAM, URETEROSCOPY AND STENT PLACEMENT (Bilateral) - 105 MINS HOLMIUM LASER APPLICATION (Bilateral) HYDROCELECTOMY ADULT (Left)  SURGEON:  Surgeon(s) and Role:    Alexis Frock, MD - Primary  PHYSICIAN ASSISTANT:   ASSISTANTS: none   ANESTHESIA:   local and general  EBL:  50 mL   BLOOD ADMINISTERED:none  DRAINS: Penrose to wound drainage left scrotum   LOCAL MEDICATIONS USED:  MARCAINE     SPECIMEN:  Source of Specimen:  bilateral renal stone fragments - discard; left hydrocele sac - discard  DISPOSITION OF SPECIMEN:  as above  COUNTS:  YES  TOURNIQUET:  * No tourniquets in log *  DICTATION: .Other Dictation: Dictation Number  [id number]  PLAN OF CARE: Discharge to home after PACU  PATIENT DISPOSITION:  PACU - hemodynamically stable.   Delay start of Pharmacological VTE agent (>24hrs) due to surgical blood loss or risk of bleeding: yes

## 2020-07-24 NOTE — Transfer of Care (Signed)
Immediate Anesthesia Transfer of Care Note  Patient: Donald Clark  Procedure(s) Performed: CYSTOSCOPY WITH RETROGRADE PYELOGRAM, URETEROSCOPY AND STENT PLACEMENT (Bilateral Ureter) HOLMIUM LASER APPLICATION (Bilateral Ureter) HYDROCELECTOMY ADULT (Left Scrotum)  Patient Location: PACU  Anesthesia Type:General  Level of Consciousness: drowsy  Airway & Oxygen Therapy: Patient Spontanous Breathing and Patient connected to nasal cannula oxygen  Post-op Assessment: Report given to RN  Post vital signs: Reviewed and stable  Last Vitals:  Vitals Value Taken Time  BP 111/74   Temp    Pulse 91 07/24/20 1108  Resp 16 07/24/20 1108  SpO2 96 % 07/24/20 1108  Vitals shown include unvalidated device data.  Last Pain:  Vitals:   07/24/20 0643  TempSrc: Oral         Complications: No complications documented.

## 2020-07-24 NOTE — Anesthesia Procedure Notes (Signed)
Procedure Name: Intubation Date/Time: 07/24/2020 8:31 AM Performed by: Bonney Aid, CRNA Pre-anesthesia Checklist: Patient identified, Emergency Drugs available, Suction available and Patient being monitored Patient Re-evaluated:Patient Re-evaluated prior to induction Oxygen Delivery Method: Circle system utilized Preoxygenation: Pre-oxygenation with 100% oxygen Induction Type: IV induction Ventilation: Mask ventilation without difficulty LMA: LMA inserted LMA Size: 5.0 Number of attempts: 1 Airway Equipment and Method: Oral airway Placement Confirmation: ETT inserted through vocal cords under direct vision,  positive ETCO2 and breath sounds checked- equal and bilateral Tube secured with: Tape Dental Injury: Teeth and Oropharynx as per pre-operative assessment

## 2020-07-25 ENCOUNTER — Encounter (HOSPITAL_BASED_OUTPATIENT_CLINIC_OR_DEPARTMENT_OTHER): Payer: Self-pay | Admitting: Urology

## 2020-07-25 NOTE — Op Note (Signed)
NAMEFITZGERALD, Clark MEDICAL RECORD NO: [id number] ACCOUNT NO: [id number] DATE OF BIRTH: January 29, 1961 FACILITY: [facility] LOCATION: [facility] PHYSICIAN: Alexis Frock, MD  Operative Report   DATE OF PROCEDURE: 07/24/2020  PREOPERATIVE DIAGNOSES: 1.  Left greater than right progressive renal stone. 2.  Large left hydrocele.  PROCEDURES:    1.  Cystoscopy with bilateral retrograde pyelograms and interpretation. 2.  Bilateral ureteroscopy with laser. 3.  Insertion of bilateral ureteral stents, 5 x 26 Polaris, no tether. 4.  Left hydrocelectomy.  ESTIMATED BLOOD LOSS:  nil  COMPLICATIONS:  None.  SPECIMENS:  1.  Bilateral renal stone fragments for discard. 2.  Left hydrocele sac simple appearing for discard.  DRAINS:  Penrose drain to dependent scrotal drainage on the left.  FINDINGS:    1.  Simple left hydrocele, 220 mL volume. 2.  Left greater than right renal stones. 3.  Complete resolution of all accessible stone fragments larger than one-third millimeter following laser lithotripsy and basket extraction. 4.  Successful placement of bilateral ureteral stents, proximal end in the renal pelvis, distal end in the urinary bladder.  INDICATIONS:  The patient is a very pleasant 60 year old man with a longstanding history of recurrent urolithiasis.  He has been very compliant with surveillance of this and was found over time to have a significant volume recurrence left greater than  right with his left stone being over 1.5 cm now, right stone approximately 8 mm.  In effort to prevent progression to major surgery, such as percutaneous nephrostolithotomy, he has been counseled towards preemptive ureteroscopy with the goal of stone  free, and he wishes to proceed for this bilateral ureteroscopy.  He is also noted to have a progressive left hydrocele that is becoming more bothersome causing significant angulation and burying of the penis, and he wishes to have this addressed  concomitantly.   No associated hernia on exam or prior imaging.  Informed consent was obtained and placed on the medical record.  PROCEDURE IN DETAIL:  The patient being Donald Clark verified, procedure being bilateral ureteroscopic stone manipulation and left hydrocelectomy was confirmed.  Procedure timeout was performed.  Intravenous antibiotics were administered.  General LMA  anesthesia was induced.  The patient was placed in the low lithotomy position.  A sterile field was created, prepped and draped the patient's penis, perineum, and proximal thighs using iodine after appropriately shaving his scrotum.  Cystourethroscopy  was performed using with a 21-French rigid cystoscope with offset lens.  Inspection of anterior and posterior urethra was unremarkable.  Inspection of the bladder revealed no diverticula, calcifications, or papillary lesions.  Ureteral orifices were  singleton bilaterally.  The left ureteral orifice was cannulated with a 6-French open-ended catheter, and a left retrograde pyelogram was obtained.  Left retrograde pyelogram demonstrated single left ureter, single system left kidney.  There was large calcification in the lower pole consistent with known stone.  There was no hydronephrosis.  A 0.038 ZIPwire was advanced to the level of upper pole  site, set aside as a safety wire.  Next, right retrograde pyelogram was obtained.  Right retrograde pyelogram demonstrated single right ureter, single system right kidney.  No filling defects or narrowing noted.  No hydronephrosis noted.  Then, a separate 0.038 ZIPwire was advanced to the level of upper pole site, set aside as a safety  wire.  An 8-French feeding tube placed in the urinary bladder for pressure release.  Next, semirigid ureteroscopy was performed of the distal four-fifth of the right ureter alongside a  separate sensor working wire.  No mucosal abnormalities were found.   Next, semirigid ureteroscopy was performed of the distal four-fifth of  the left ureter alongside a separate sensor working wire.  No mucosal abnormalities were found.  Next, the semirigid scope was exchanged for a 12/14 medium length ureteral access  sheath over the right sensor working wire to the level of the proximal ureter.  Using continuous fluoroscopic guidance, a flexible digital ureteroscopy was performed of the proximal ureter, inspection of the right kidney including all calices x3.  The  calcifications noted on previous plain films were upper pole and just barely submucosal.  These were opened with a holmium laser using settings of 1 joule and 10 Hz allowing 2 stones to enter upper pole calix.  These were each too large for simple  basketing.  As such, holmium laser energy was applied to the stone using settings of 0.2 joules and 20 Hz.  These were dusted for approximately 50% and the remaining 50% fragmented to pieces that were amenable to simple basket with the Escape basket.   They were removed such that all stone fragments larger than one-third mm were removed from the right side.  Access sheath was removed under continuous vision.  No significant mucosal abnormalities were found. Next, the access sheath was placed over the  left sensor working wire to the level of proximal left ureter.  Using continuous fluoroscopic guidance, a flexible digital ureteroscopy was performed of the proximal left ureter and systematic inspection of the left kidney including all calices x3.  As  anticipated, there was a fairly large calcification in the lower pole.  This was estimated to be at least 1.5 cm.  It was much too large for simple basketing.  It was then grasped with the Escape basket and repositioned into an upper pole calix such that  there was less acute angulation.  This was much more favorable for laser lithotripsy, and a dusting technique was performed using settings of 0.2 joules and frequency escalating to 50 Hz. Using dusting technique, approximately 95% of the stone  was  dusted.  This took a significant amount of time, but was quite safe and achieved the goals of dusting the vast majority of the stone.  The remaining small fragments were amenable to simple basketing.  They were grasped with the Escape basket, removed  sequentially and set aside.  Following this, complete resolution of all accessible stone fragments larger than one-third mm.  Excellent hemostasis.  No evidence of renal if any sort of renal perforation.   The access sheath was removed under continuous  vision.  No significant mucosal abnormalities were found.  Given large volume stone, it was felt that interval stenting with non-tethered stent would be most advantageous.  As such, bilateral 5 x 26 Polaris-type stents were placed using fluoroscopic  guidance.  Good proximal and distal planes were noted.  Attention was directed to hydrocelectomy.  The previous sterile field was reprepped using additional iodine.  Surgeon and assistant changed gloves. Additional sterile towels were placed around the field.  An incision was made along the median raphe of the scrotum for a distance of approximately 4  cm into the left hemiscrotum.  A large left hydrocele was carefully delivered.  This appeared simple without associated hernia.  It was purposely incised on antitesticular border and 220 mL of simple straw-colored fluid were drained.  Hydrocele was then  incised further inverted and approximately 70% of the sac removed.  The sites  were discarded and the everted edges were reapproximated using running Vicryl.  Exquisite care was taken to avoid any devascularization of the testicle, which did not occur.   Given the fairly large hydrocele sac volume, this created a quite large space in the left hemiscrotum and a single pexy suture was placed of the left lateral paratesticular tissues and inner scrotal leaflet to prevent future torsion.  Testicle was  delivered into the left hemiscrotum.  A small counter incision  was made approximately 2 inches away from the scrotal incision and a Penrose drain was brought through this into the left scrotal compartment, anchored in place with a single nylon suture,  and the incision was reapproximated first at the level of the dartos using running Vicryl followed by running 4-0 Vicryl at the level of the skin, which resulted in excellent cosmesis.  10 mL of plain 0.25% Marcaine were then placed along the area of the  incision and then additional 10 mL was placed along the left cord in a cord block type fashion.  Dressing of ABD pad, fluff, and mesh underwear was placed.  The procedure was terminated.  The patient tolerated the procedure well.  No immediate  periprocedural complications.  The patient was taken to postanesthesia care unit in stable condition.  Plan for discharge home.   [facility] D: 07/24/2020 11:03:53 am T: 07/24/2020 3:12:00 pm  JOB: [id number]

## 2020-09-24 ENCOUNTER — Other Ambulatory Visit: Payer: Self-pay | Admitting: Family Medicine

## 2020-09-25 ENCOUNTER — Other Ambulatory Visit: Payer: Self-pay | Admitting: Family Medicine

## 2020-10-15 ENCOUNTER — Other Ambulatory Visit: Payer: Self-pay

## 2020-10-16 ENCOUNTER — Ambulatory Visit: Payer: BC Managed Care – PPO | Admitting: Family Medicine

## 2020-10-16 ENCOUNTER — Encounter: Payer: Self-pay | Admitting: Family Medicine

## 2020-10-16 VITALS — BP 134/84 | HR 78 | Temp 97.8°F | Wt 181.4 lb

## 2020-10-16 DIAGNOSIS — F418 Other specified anxiety disorders: Secondary | ICD-10-CM | POA: Diagnosis not present

## 2020-10-16 DIAGNOSIS — M545 Low back pain, unspecified: Secondary | ICD-10-CM | POA: Diagnosis not present

## 2020-10-16 MED ORDER — LORAZEPAM 0.5 MG PO TABS: 0.5000 mg | ORAL_TABLET | Freq: Three times a day (TID) | ORAL | 0 refills | Status: DC | PRN

## 2020-10-16 MED ORDER — METHOCARBAMOL 500 MG PO TABS: 500.0000 mg | ORAL_TABLET | Freq: Three times a day (TID) | ORAL | 0 refills | Status: DC | PRN

## 2020-10-16 NOTE — Progress Notes (Signed)
Established Patient Office Visit  Subjective:  Patient ID: Donald Clark, male    DOB: [birth year]  Age: 60 y.o. 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  He does not have any history of any major infectious diseases.  He has immunizations are up-to-date.  No specific risk factors for tuberculosis.  He has back pain which is really more lower thoracic area started about 3 weeks ago.  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  His pain started after that.  He is tried multiple things including ibuprofen, heat, ice, stretches without much improvement.  He feels like he has some stiffness.  Some pain with movement.  No radiculitis symptoms.  No lower lumbar pain.  No spinal tenderness.  No fevers or chills.  No appetite changes.  He had some recent increased anxiety symptoms.  He had surgery earlier this summer for hydrocele and had complications with large hematoma.  He is recovering from that.  He and his wife had provided care for 2 nieces ages 41 and 83 are having significant stress issues related to that.  They have worked out other care arrangements for their nieces at this time.  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  He has previously taken low-dose lorazepam only for very short-term use and basically requesting refill.  Uses this very infrequently.  No major depression issues.  Past Medical History:  Diagnosis Date   Allergy    Anxiety    Asthma     exercise induced mild no inhaler use   Autoimmune disorder ([place])    never dx occ causes joint and muscle weakness/soreness   Cancer ([place])    skin- multiple removed-1 basal cell  2016, 1 squamous  cell 2002   Chicken pox as child   Depression    GERD (gastroesophageal reflux disease)    Headache    History of kidney stones    History of migraine 2018   none since 2018   Hypertension    Prostatitis    Shingles 2017   torso, no deficit from   Wears glasses     Past Surgical History:  Procedure Laterality Date   COLONOSCOPY  2019   CYSTOSCOPY WITH RETROGRADE PYELOGRAM, URETEROSCOPY AND STENT PLACEMENT Bilateral 07/24/2020   Procedure: CYSTOSCOPY WITH RETROGRADE PYELOGRAM, URETEROSCOPY AND STENT PLACEMENT;  Surgeon: Alexis Frock, MD;  Location: [facility];  Service: Urology;  Laterality: Bilateral;  [place] LITHOTRIPSY Right 03/29/2017   Procedure: RIGHT EXTRACORPOREAL SHOCK WAVE LITHOTRIPSY (ESWL);  Surgeon: Nickie Retort, MD;  Location: [facility];  Service: Urology;  Laterality: Right;   HOLMIUM LASER APPLICATION Bilateral 0000000   Procedure: HOLMIUM LASER APPLICATION;  Surgeon: Alexis Frock, MD;  Location: [facility];  Service: Urology;  Laterality: Bilateral;   HYDROCELE EXCISION Left 07/24/2020   Procedure: HYDROCELECTOMY ADULT;  Surgeon: Alexis Frock, MD;  Location: [facility];  Service: Urology;  Laterality: Left;   LITHOTRIPSY  last done jan 2019   x 6   skin cancer removed     on ankle, back neck  UPPER GASTROINTESTINAL ENDOSCOPY  not sure when    Family History  Problem Relation Age of Onset   Cancer Mother        colon, uterine CA   Hypertension Mother    Colon cancer Mother    Cancer Father 61       colon cancer   Rectal cancer Father    Colon cancer Father    Diabetes Maternal Grandfather    Diabetes Paternal Grandmother    Cancer Brother 16       kidney cancer   Esophageal cancer Neg Hx    Stomach cancer Neg Hx    Ulcerative colitis Neg Hx     Social History   Socioeconomic History   Marital status: Married    Spouse name: Not on file   Number of  children: Not on file   Years of education: Not on file   Highest education level: Not on file  Occupational History   Not on file  Tobacco Use   Smoking status: Former    Packs/day: 0.50    Years: 4.00    Pack years: 2.00    Types: Cigarettes    Quit date: 10/13/1980    Years since quitting: 40.0   Smokeless tobacco: Never  Vaping Use   Vaping Use: Never used  Substance and Sexual Activity   Alcohol use: Yes    Alcohol/week: 0.0 standard drinks    Comment: occ   Drug use: No   Sexual activity: Yes  Other Topics Concern   Not on file  Social History Narrative   Not on file   Social Determinants of Health   Financial Resource Strain: Not on file  Food Insecurity: Not on file  Transportation Needs: Not on file  Physical Activity: Not on file  Stress: Not on file  Social Connections: Not on file  Intimate Partner Violence: Not on file    Outpatient Medications Prior to Visit  Medication Sig Dispense Refill   amLODipine (NORVASC) 10 MG tablet TAKE 1 TABLET BY MOUTH EVERY DAY 90 tablet 0   buPROPion (WELLBUTRIN XL) 300 MG 24 hr tablet TAKE 1 TABLET BY MOUTH EVERY DAY 90 tablet 0   lansoprazole (PREVACID SOLUTAB) 15 MG disintegrating tablet Take 15 mg by mouth daily at 12 noon.     loratadine (CLARITIN) 10 MG tablet Take 10 mg by mouth daily.     Multiple Vitamin (MULTIVITAMIN WITH MINERALS) TABS Take 1 tablet by mouth daily. Reported on 06/12/2015     OVER THE COUNTER MEDICATION Zinc 50 mg One tablet daily.     OVER THE COUNTER MEDICATION Selenium, 200 mcg. One tablet daily.     tadalafil (CIALIS) 5 MG tablet Take 1 tablet (5 mg total) by mouth daily. 90 tablet 3   LORazepam (ATIVAN) 0.5 MG tablet Take 1 tablet (0.5 mg total) by mouth every 8 (eight) hours as needed for anxiety. 20 tablet 0   ketorolac (TORADOL) 10 MG tablet Take 1 tablet (10 mg total) by mouth every 8 (eight) hours as needed for moderate pain. Or stent discomfort post-operatively 20 tablet 0    oxyCODONE-acetaminophen (PERCOCET) 5-325 MG tablet Take 1 tablet by mouth every 6 (six) hours as needed for severe pain (Post-operatively). 15 tablet 0   senna-docusate (SENOKOT-S) 8.6-50 MG tablet Take 1 tablet by mouth 2 (two) times daily. While taking strongest pain meds to prevent constipation 10 tablet 0   No facility-administered medications prior to visit.    Allergies  Allergen  Reactions   Penicillins     GI upset Has patient had a PCN reaction causing immediate rash, facial/tongue/throat swelling, SOB or lightheadedness with hypotension: No Has patient had a PCN reaction causing severe rash involving mucus membranes or skin necrosis: No Has patient had a PCN reaction that required hospitalization no Has patient had a PCN reaction occurring within the last 10 years: Yes If all of the above answers are "NO", then may proceed with Cephalosporin use.    Sulfa Antibiotics     Hives, joint pain, rash    ROS Review of Systems  Constitutional:  Negative for activity change, appetite change and fever.  Respiratory:  Negative for cough and shortness of breath.   Cardiovascular:  Negative for chest pain and leg swelling.  Gastrointestinal:  Negative for abdominal pain and vomiting.  Genitourinary:  Negative for dysuria, flank pain and hematuria.  Musculoskeletal:  Positive for back pain. Negative for joint swelling.  Neurological:  Negative for weakness and numbness.  Psychiatric/Behavioral:  Negative for dysphoric mood. The patient is nervous/anxious.      Objective:    Physical Exam Vitals reviewed.  Constitutional:      Appearance: Normal appearance.  Cardiovascular:     Rate and Rhythm: Normal rate and regular rhythm.  Pulmonary:     Effort: Pulmonary effort is normal.     Breath sounds: Normal breath sounds.  Musculoskeletal:     Comments: No spinal tenderness.  He has some mild muscular tenderness bilaterally lower thoracic region.  Neurological:     General: No  focal deficit present.     Mental Status: He is alert.  Psychiatric:        Mood and Affect: Mood normal.        Behavior: Behavior normal.    BP 134/84 (BP Location: Left Arm, Patient Position: Sitting, Cuff Size: Normal)   Pulse 78   Temp 97.8 F (36.6 C) (Oral)   Wt 181 lb 6.4 oz (82.3 kg)   SpO2 97%   BMI 28.41 kg/m  Wt Readings from Last 3 Encounters:  10/16/20 181 lb 6.4 oz (82.3 kg)  07/24/20 181 lb (82.1 kg)  04/05/20 177 lb (80.3 kg)     Health Maintenance Due  Topic Date Due   HIV Screening  Never done   Zoster Vaccines- Shingrix (1 of 2) Never done   COVID-19 Vaccine (4 - Booster for Pfizer series) 06/08/2020   INFLUENZA VACCINE  09/30/2020    There are no preventive care reminders to display for this patient.  Lab Results  Component Value Date   TSH 1.98 04/05/2020   Lab Results  Component Value Date   WBC 4.5 04/05/2020   HGB 15.6 07/24/2020   HCT 46.0 07/24/2020   MCV 85.6 04/05/2020   PLT 213.0 04/05/2020   Lab Results  Component Value Date   NA 143 07/24/2020   K 3.7 07/24/2020   CO2 28 04/05/2020   GLUCOSE 96 07/24/2020   BUN 16 07/24/2020   CREATININE 0.90 07/24/2020   BILITOT 0.8 04/05/2020   ALKPHOS 67 04/05/2020   AST 17 04/05/2020   ALT 23 04/05/2020   PROT 6.8 04/05/2020   ALBUMIN 4.6 04/05/2020   CALCIUM 9.6 04/05/2020   ANIONGAP 6 01/20/2016   GFR 78.19 04/05/2020   Lab Results  Component Value Date   CHOL 180 04/05/2020   Lab Results  Component Value Date   HDL 62.50 04/05/2020   Lab Results  Component Value Date   LDLCALC  102 (H) 04/05/2020   Lab Results  Component Value Date   TRIG 79.0 04/05/2020   Lab Results  Component Value Date   CHOLHDL 3 04/05/2020   No results found for: HGBA1C    Assessment & Plan:   #1 lower thoracic back pain bilaterally.  Suspect muscular.  Patient has tried multiple conservative things without improvement.  We wrote for limited Robaxin 500 mg every 8 hours as needed for  muscle spasm.  He is aware this may be sedating.  Continue stretches, heat, ice, ibuprofen Touch base if not improving next couple weeks  #2 situational anxiety.  We discussed conservative management in the past with nonpharmacologic factors such as adequate sleep, exercise, etc. -Wrote for limited lorazepam 0.5 mg 1 every 8 hours only as needed for severe anxiety.  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  No major concerns.  He has no specific risk factors for TB or any other communicable diseases Forms completed   Meds ordered this encounter  Medications   methocarbamol (ROBAXIN) 500 MG tablet    Sig: Take 1 tablet (500 mg total) by mouth every 8 (eight) hours as needed for muscle spasms.    Dispense:  30 tablet    Refill:  0   LORazepam (ATIVAN) 0.5 MG tablet    Sig: Take 1 tablet (0.5 mg total) by mouth every 8 (eight) hours as needed for anxiety.    Dispense:  30 tablet    Refill:  0    Follow-up: No follow-ups on file.    Carolann Littler, MD

## 2020-10-25 ENCOUNTER — Telehealth: Payer: Self-pay | Admitting: Family Medicine

## 2020-12-20 NOTE — Telephone Encounter (Signed)
error 

## 2020-12-26 ENCOUNTER — Other Ambulatory Visit: Payer: Self-pay | Admitting: Family Medicine

## 2020-12-27 ENCOUNTER — Other Ambulatory Visit: Payer: Self-pay | Admitting: Family Medicine

## 2021-02-17 ENCOUNTER — Ambulatory Visit (INDEPENDENT_AMBULATORY_CARE_PROVIDER_SITE_OTHER): Payer: BC Managed Care – PPO

## 2021-02-17 DIAGNOSIS — Z23 Encounter for immunization: Secondary | ICD-10-CM

## 2021-03-24 ENCOUNTER — Other Ambulatory Visit: Payer: Self-pay | Admitting: Family Medicine

## 2021-03-25 ENCOUNTER — Other Ambulatory Visit: Payer: Self-pay | Admitting: Family Medicine

## 2021-04-07 ENCOUNTER — Ambulatory Visit (INDEPENDENT_AMBULATORY_CARE_PROVIDER_SITE_OTHER): Payer: BC Managed Care – PPO | Admitting: Family Medicine

## 2021-04-07 VITALS — BP 120/64 | HR 75 | Temp 98.4°F | Ht 67.0 in | Wt 169.0 lb

## 2021-04-07 DIAGNOSIS — Z125 Encounter for screening for malignant neoplasm of prostate: Secondary | ICD-10-CM | POA: Diagnosis not present

## 2021-04-07 DIAGNOSIS — I10 Essential (primary) hypertension: Secondary | ICD-10-CM

## 2021-04-07 DIAGNOSIS — Z0000 Encounter for general adult medical examination without abnormal findings: Secondary | ICD-10-CM

## 2021-04-07 LAB — BASIC METABOLIC PANEL
BUN: 15 mg/dL (ref 6–23)
CO2: 35 mEq/L — ABNORMAL HIGH (ref 19–32)
Calcium: 9.5 mg/dL (ref 8.4–10.5)
Chloride: 101 mEq/L (ref 96–112)
Creatinine, Ser: 1.06 mg/dL (ref 0.40–1.50)
GFR: 75.88 mL/min (ref >60.00)
Glucose, Bld: 84 mg/dL (ref 70–99)
Potassium: 4.1 mEq/L (ref 3.5–5.1)
Sodium: 141 mEq/L (ref 135–145)

## 2021-04-07 LAB — CBC WITH DIFFERENTIAL/PLATELET
Basophils Absolute: 0.1 10*3/uL (ref 0.0–0.1)
Basophils Relative: 1.2 % (ref 0.0–3.0)
Eosinophils Absolute: 0.1 10*3/uL (ref 0.0–0.7)
Eosinophils Relative: 2.8 % (ref 0.0–5.0)
HCT: 47.3 % (ref 39.0–52.0)
Hemoglobin: 15.6 g/dL (ref 13.0–17.0)
Lymphocytes Relative: 24.7 % (ref 12.0–46.0)
Lymphs Abs: 1.3 10*3/uL (ref 0.7–4.0)
MCHC: 33 g/dL (ref 30.0–36.0)
MCV: 85.9 fl (ref 78.0–100.0)
Monocytes Absolute: 0.5 10*3/uL (ref 0.1–1.0)
Monocytes Relative: 8.8 % (ref 3.0–12.0)
Neutro Abs: 3.3 10*3/uL (ref 1.4–7.7)
Neutrophils Relative %: 62.5 % (ref 43.0–77.0)
Platelets: 218 10*3/uL (ref 150.0–400.0)
RBC: 5.5 Mil/uL (ref 4.22–5.81)
RDW: 12.8 % (ref 11.5–15.5)
WBC: 5.2 10*3/uL (ref 4.0–10.5)

## 2021-04-07 LAB — LIPID PANEL
Cholesterol: 171 mg/dL (ref 0–200)
HDL: 57.9 mg/dL (ref >39.00)
LDL Cholesterol: 97 mg/dL (ref 0–99)
NonHDL: 113.3
Total CHOL/HDL Ratio: 3
Triglycerides: 80 mg/dL (ref 0.0–149.0)
VLDL: 16 mg/dL (ref 0.0–40.0)

## 2021-04-07 LAB — HEPATIC FUNCTION PANEL
ALT: 17 U/L (ref 0–53)
AST: 15 U/L (ref 0–37)
Albumin: 4.6 g/dL (ref 3.5–5.2)
Alkaline Phosphatase: 61 U/L (ref 39–117)
Bilirubin, Direct: 0.2 mg/dL (ref 0.0–0.3)
Total Bilirubin: 0.8 mg/dL (ref 0.2–1.2)
Total Protein: 6.7 g/dL (ref 6.0–8.3)

## 2021-04-07 LAB — PSA: PSA: 1.48 ng/mL (ref 0.10–4.00)

## 2021-04-07 LAB — TSH: TSH: 2.16 u[IU]/mL (ref 0.35–5.50)

## 2021-04-07 MED ORDER — TADALAFIL 5 MG PO TABS: 5.0000 mg | ORAL_TABLET | Freq: Every day | ORAL | 3 refills | Status: DC

## 2021-04-07 NOTE — Progress Notes (Signed)
Established Patient Office Visit  Subjective:  Patient ID: Donald Clark, male    DOB: 12-29-1960  Age: 61 y.o. MRN: [id number]  CC:  Chief Complaint  Patient presents with   Annual Exam    HPI Donald Clark presents for physical exam.  He has history of kidney stones, GERD, hypertension, depression.  Back pain from last August has resolved.  He has history of nocturia and those symptoms have been stable on Cialis 5 mg daily.  Requesting refills.  Health maintenance reviewed  -Flu vaccine already given -No history of Shingrix -Tetanus due 2032 -Colonoscopy due 6/25 -Previous hepatitis C screen negative  Family history-both parents had colon cancer early 36s.  Mother with hypertension.  Brother with some type of rare renal cancer.  He had 2 grandparents with type 2 diabetes  Social history-he is married.  Daughter graduated from [employer] last year and has working in social work here in [place].  Son Multimedia programmer up Pharmacologist at [employer].  Quit smoking 1982.  Only smoked for 3 to 4 years.  Alcohol about 3 beers per week   Past Medical History:  Diagnosis Date   Allergy    Anxiety    Asthma     exercise induced mild no inhaler use   Autoimmune disorder ([place])    never dx occ causes joint and muscle weakness/soreness   Cancer ([place])    skin- multiple removed-1 basal cell  2016, 1 squamous cell 2002   Chicken pox as child   Depression    GERD (gastroesophageal reflux disease)    Headache    History of kidney stones    History of migraine 2018   none since 2018   Hypertension    Prostatitis    Shingles 2017   torso, no deficit from   Wears glasses     Past Surgical History:  Procedure Laterality Date   COLONOSCOPY  2019   CYSTOSCOPY WITH RETROGRADE PYELOGRAM, URETEROSCOPY AND STENT PLACEMENT Bilateral 07/24/2020   Procedure: CYSTOSCOPY WITH RETROGRADE PYELOGRAM, URETEROSCOPY AND STENT PLACEMENT;  Surgeon: Alexis Frock, MD;   Location: [facility];  Service: Urology;  Laterality: Bilateral;  [place] LITHOTRIPSY Right 03/29/2017   Procedure: RIGHT EXTRACORPOREAL SHOCK WAVE LITHOTRIPSY (ESWL);  Surgeon: Nickie Retort, MD;  Location: [facility];  Service: Urology;  Laterality: Right;   HOLMIUM LASER APPLICATION Bilateral 0/63/0160   Procedure: HOLMIUM LASER APPLICATION;  Surgeon: Alexis Frock, MD;  Location: [facility];  Service: Urology;  Laterality: Bilateral;   HYDROCELE EXCISION Left 07/24/2020   Procedure: HYDROCELECTOMY ADULT;  Surgeon: Alexis Frock, MD;  Location: [facility];  Service: Urology;  Laterality: Left;   LITHOTRIPSY  last done jan 2019   x 6   skin cancer removed     on ankle, back neck   UPPER GASTROINTESTINAL ENDOSCOPY  not sure when    Family History  Problem Relation Age of Onset   Cancer Mother        colon, uterine CA   Hypertension Mother    Colon cancer Mother    Cancer Father 24       colon cancer   Rectal cancer Father    Colon cancer Father    Diabetes Maternal Grandfather    Diabetes Paternal Grandmother    Cancer Brother 68       kidney cancer   Esophageal cancer Neg Hx    Stomach cancer  Neg Hx    Ulcerative colitis Neg Hx     Social History   Socioeconomic History   Marital status: Married    Spouse name: Not on file   Number of children: Not on file   Years of education: Not on file   Highest education level: Not on file  Occupational History   Not on file  Tobacco Use   Smoking status: Former    Packs/day: 0.50    Years: 4.00    Pack years: 2.00    Types: Cigarettes    Quit date: 10/13/1980    Years since quitting: 40.5   Smokeless tobacco: Never  Vaping Use   Vaping Use: Never used  Substance and Sexual Activity   Alcohol use: Yes    Alcohol/week: 0.0 standard drinks    Comment: occ   Drug use: No   Sexual activity: Yes  Other Topics Concern   Not on file  Social  History Narrative   Not on file   Social Determinants of Health   Financial Resource Strain: Not on file  Food Insecurity: Not on file  Transportation Needs: Not on file  Physical Activity: Not on file  Stress: Not on file  Social Connections: Not on file  Intimate Partner Violence: Not on file    Outpatient Medications Prior to Visit  Medication Sig Dispense Refill   amLODipine (NORVASC) 10 MG tablet TAKE 1 TABLET BY MOUTH EVERY DAY 90 tablet 0   buPROPion (WELLBUTRIN XL) 300 MG 24 hr tablet TAKE 1 TABLET BY MOUTH EVERY DAY 90 tablet 0   indapamide (LOZOL) 2.5 MG tablet Take 2.5 mg by mouth daily.     lansoprazole (PREVACID SOLUTAB) 15 MG disintegrating tablet Take 15 mg by mouth daily at 12 noon.     loratadine (CLARITIN) 10 MG tablet Take 10 mg by mouth daily.     LORazepam (ATIVAN) 0.5 MG tablet Take 1 tablet (0.5 mg total) by mouth every 8 (eight) hours as needed for anxiety. 30 tablet 0   methocarbamol (ROBAXIN) 500 MG tablet Take 1 tablet (500 mg total) by mouth every 8 (eight) hours as needed for muscle spasms. 30 tablet 0   Multiple Vitamin (MULTIVITAMIN WITH MINERALS) TABS Take 1 tablet by mouth daily. Reported on 06/12/2015     OVER THE COUNTER MEDICATION Zinc 50 mg One tablet daily.     OVER THE COUNTER MEDICATION Selenium, 200 mcg. One tablet daily.     tadalafil (CIALIS) 5 MG tablet Take 1 tablet (5 mg total) by mouth daily. 90 tablet 3   No facility-administered medications prior to visit.    Allergies  Allergen Reactions   Penicillins     GI upset Has patient had a PCN reaction causing immediate rash, facial/tongue/throat swelling, SOB or lightheadedness with hypotension: No Has patient had a PCN reaction causing severe rash involving mucus membranes or skin necrosis: No Has patient had a PCN reaction that required hospitalization no Has patient had a PCN reaction occurring within the last 10 years: Yes If all of the above answers are "NO", then may proceed with  Cephalosporin use.    Sulfa Antibiotics     Hives, joint pain, rash    ROS Review of Systems  Constitutional:  Negative for activity change, appetite change, fatigue and fever.  HENT:  Negative for congestion, ear pain and trouble swallowing.   Eyes:  Negative for pain and visual disturbance.  Respiratory:  Negative for cough, shortness of breath and wheezing.  Cardiovascular:  Negative for chest pain and palpitations.  Gastrointestinal:  Negative for abdominal distention, abdominal pain, blood in stool, constipation, diarrhea, nausea, rectal pain and vomiting.  Genitourinary:  Negative for hematuria and testicular pain.  Musculoskeletal:  Negative for arthralgias and joint swelling.  Skin:  Negative for rash.  Neurological:  Negative for dizziness, syncope and headaches.  Hematological:  Negative for adenopathy.  Psychiatric/Behavioral:  Negative for confusion and dysphoric mood.      Objective:    Physical Exam Constitutional:      General: He is not in acute distress.    Appearance: He is well-developed.  HENT:     Head: Normocephalic and atraumatic.     Right Ear: External ear normal.     Left Ear: External ear normal.  Eyes:     Conjunctiva/sclera: Conjunctivae normal.     Pupils: Pupils are equal, round, and reactive to light.  Neck:     Thyroid: No thyromegaly.  Cardiovascular:     Rate and Rhythm: Normal rate and regular rhythm.     Heart sounds: Normal heart sounds. No murmur heard. Pulmonary:     Effort: No respiratory distress.     Breath sounds: No wheezing or rales.  Abdominal:     General: Bowel sounds are normal. There is no distension.     Palpations: Abdomen is soft. There is no mass.     Tenderness: There is no abdominal tenderness. There is no guarding or rebound.  Musculoskeletal:     Cervical back: Normal range of motion and neck supple.  Lymphadenopathy:     Cervical: No cervical adenopathy.  Skin:    Findings: No rash.  Neurological:      Mental Status: He is alert and oriented to person, place, and time.     Cranial Nerves: No cranial nerve deficit.    BP 120/64 (BP Location: Left Arm, Patient Position: Sitting, Cuff Size: Normal)    Pulse 75    Temp 98.4 F (36.9 C) (Oral)    Ht 5\' 7"  (1.702 m)    Wt 169 lb (76.7 kg)    SpO2 99%    BMI 26.47 kg/m  Wt Readings from Last 3 Encounters:  04/07/21 169 lb (76.7 kg)  10/16/20 181 lb 6.4 oz (82.3 kg)  07/24/20 181 lb (82.1 kg)     Health Maintenance Due  Topic Date Due   HIV Screening  Never done   Zoster Vaccines- Shingrix (1 of 2) Never done   COVID-19 Vaccine (4 - Booster for Pfizer series) 04/04/2020    There are no preventive care reminders to display for this patient.  Lab Results  Component Value Date   TSH 1.98 04/05/2020   Lab Results  Component Value Date   WBC 4.5 04/05/2020   HGB 15.6 07/24/2020   HCT 46.0 07/24/2020   MCV 85.6 04/05/2020   PLT 213.0 04/05/2020   Lab Results  Component Value Date   NA 143 07/24/2020   K 3.7 07/24/2020   CO2 28 04/05/2020   GLUCOSE 96 07/24/2020   BUN 16 07/24/2020   CREATININE 0.90 07/24/2020   BILITOT 0.8 04/05/2020   ALKPHOS 67 04/05/2020   AST 17 04/05/2020   ALT 23 04/05/2020   PROT 6.8 04/05/2020   ALBUMIN 4.6 04/05/2020   CALCIUM 9.6 04/05/2020   ANIONGAP 6 01/20/2016   GFR 78.19 04/05/2020   Lab Results  Component Value Date   CHOL 180 04/05/2020   Lab Results  Component Value Date  HDL 62.50 04/05/2020   Lab Results  Component Value Date   LDLCALC 102 (H) 04/05/2020   Lab Results  Component Value Date   TRIG 79.0 04/05/2020   Lab Results  Component Value Date   CHOLHDL 3 04/05/2020   No results found for: HGBA1C    Assessment & Plan:   Problem List Items Addressed This Visit   None Visit Diagnoses     Physical exam    -  Primary   Relevant Orders   Basic metabolic panel   Lipid panel   CBC with Differential/Platelet   TSH   Hepatic function panel   PSA      -Consider Shingrix vaccine.  He will check on insurance coverage  -Obtain screening labs including PSA  -Continue with annual flu vaccine  Meds ordered this encounter  Medications   tadalafil (CIALIS) 5 MG tablet    Sig: Take 1 tablet (5 mg total) by mouth daily.    Dispense:  90 tablet    Refill:  3    Follow-up: No follow-ups on file.    Carolann Littler, MD

## 2021-04-07 NOTE — Patient Instructions (Signed)
Consider Shingirx vaccine at some point this year and check on insurance coverage if interested.

## 2021-05-14 ENCOUNTER — Encounter: Payer: Self-pay | Admitting: Family Medicine

## 2021-05-14 ENCOUNTER — Ambulatory Visit: Payer: BC Managed Care – PPO | Admitting: Family Medicine

## 2021-05-14 VITALS — BP 136/80 | HR 77 | Temp 97.5°F | Ht 67.0 in | Wt 175.0 lb

## 2021-05-14 DIAGNOSIS — L300 Nummular dermatitis: Secondary | ICD-10-CM

## 2021-05-14 MED ORDER — TRIAMCINOLONE ACETONIDE 0.1 % EX CREA: 1.0000 "application " | TOPICAL_CREAM | Freq: Two times a day (BID) | CUTANEOUS | 1 refills | Status: DC | PRN

## 2021-05-14 NOTE — Progress Notes (Signed)
? ?Established Patient Office Visit ? ?Subjective:  ?Patient ID: Donald Clark, male    DOB: 1960/12/28  Age: 61 y.o. MRN: [id number] ? ?CC:  ?Chief Complaint  ?Patient presents with  ? Mass  ?  Patient complains bi-laterlal leg lumps, x2 weeks   ? Pruritis  ?  Patient complains of Bi- lateral leg pruritis   ? ? ?HPI ?[name] presents for couple of areas of rash lower extremities.  He has small scaly patch left anterior thigh and 1 on the right thigh.  No true "lumps ".  He does have small dermatofibroma just above the left knee region.  He tried some over-the-counter 1% hydrocortisone on the rash without much change.  He does have some associated itching.  In general, he is noted his skin is much drier.  He does use some moisturizer intermittently.  Denies any generalized rash or trunk rash. ? ?Past Medical History:  ?Diagnosis Date  ? Allergy   ? Anxiety   ? Asthma   ?  exercise induced mild no inhaler use  ? Autoimmune disorder ([place])   ? never dx occ causes joint and muscle weakness/soreness  ? Cancer [facility])   ? skin- multiple removed-1 basal cell  2016, 1 squamous cell 2002  ? Chicken pox as child  ? Depression   ? GERD (gastroesophageal reflux disease)   ? Headache   ? History of kidney stones   ? History of migraine 2018  ? none since 2018  ? Hypertension   ? Prostatitis   ? Shingles 2017  ? torso, no deficit from  ? Wears glasses   ? ? ?Past Surgical History:  ?Procedure Laterality Date  ? COLONOSCOPY  2019  ? CYSTOSCOPY WITH RETROGRADE PYELOGRAM, URETEROSCOPY AND STENT PLACEMENT Bilateral 07/24/2020  ? Procedure: CYSTOSCOPY WITH RETROGRADE PYELOGRAM, URETEROSCOPY AND STENT PLACEMENT;  Surgeon: Alexis Frock, MD;  Location: [facility];  Service: Urology;  Laterality: Bilateral;  105 MINS  ? EXTRACORPOREAL SHOCK WAVE LITHOTRIPSY Right 03/29/2017  ? Procedure: RIGHT EXTRACORPOREAL SHOCK WAVE LITHOTRIPSY (ESWL);  Surgeon: Nickie Retort, MD;  Location[facility];  Service: Urology;   Laterality: Right;  ? HOLMIUM LASER APPLICATION Bilateral 10/27/32  ? Procedure: HOLMIUM LASER APPLICATION;  Surgeon: Alexis Frock, MD;  [facility];  Service: Urology;  Laterality: Bilateral;  ? HYDROCELE EXCISION Left 07/24/2020  ? Procedure: HYDROCELECTOMY ADULT;  Surgeon: Alexis Frock, MD;  Location[facility];  Service: Urology;  Laterality: Left;  ? LITHOTRIPSY  last done jan 2019  ? x 6  ? skin cancer removed    ? on ankle, back neck  ? UPPER GASTROINTESTINAL ENDOSCOPY  not sure when  ? ? ?Family History  ?Problem Relation Age of Onset  ? Cancer Mother   ?     colon, uterine CA  ? Hypertension Mother   ? Colon cancer Mother   ? Cancer Father 38  ?     colon cancer  ? Rectal cancer Father   ? Colon cancer Father   ? Diabetes Maternal Grandfather   ? Diabetes Paternal Grandmother   ? Cancer Brother 37  ?     kidney cancer  ? Esophageal cancer Neg Hx   ? Stomach cancer Neg Hx   ? Ulcerative colitis Neg Hx   ? ? ?Social History  ? ?Socioeconomic History  ? Marital status: Married  ?  Spouse name: Not on file  ? Number of children: Not on  file  ? Years of education: Not on file  ? Highest education level: Not on file  ?Occupational History  ? Not on file  ?Tobacco Use  ? Smoking status: Former  ?  Packs/day: 0.50  ?  Years: 4.00  ?  Pack years: 2.00  ?  Types: Cigarettes  ?  Quit date: 10/13/1980  ?  Years since quitting: 40.6  ? Smokeless tobacco: Never  ?Vaping Use  ? Vaping Use: Never used  ?Substance and Sexual Activity  ? Alcohol use: Yes  ?  Alcohol/week: 0.0 standard drinks  ?  Comment: occ  ? Drug use: No  ? Sexual activity: Yes  ?Other Topics Concern  ? Not on file  ?Social History Narrative  ? Not on file  ? ?Social Determinants of Health  ? ?Financial Resource Strain: Not on file  ?Food Insecurity: Not on file  ?Transportation Needs: Not on file  ?Physical Activity: Not on file  ?Stress: Not on file  ?Social Connections: Not on file  ?Intimate Partner  Violence: Not on file  ? ? ?Outpatient Medications Prior to Visit  ?Medication Sig Dispense Refill  ? amLODipine (NORVASC) 10 MG tablet TAKE 1 TABLET BY MOUTH EVERY DAY 90 tablet 0  ? buPROPion (WELLBUTRIN XL) 300 MG 24 hr tablet TAKE 1 TABLET BY MOUTH EVERY DAY 90 tablet 0  ? indapamide (LOZOL) 2.5 MG tablet Take 2.5 mg by mouth daily.    ? lansoprazole (PREVACID SOLUTAB) 15 MG disintegrating tablet Take 15 mg by mouth daily at 12 noon.    ? loratadine (CLARITIN) 10 MG tablet Take 10 mg by mouth daily.    ? methocarbamol (ROBAXIN) 500 MG tablet Take 1 tablet (500 mg total) by mouth every 8 (eight) hours as needed for muscle spasms. 30 tablet 0  ? Multiple Vitamin (MULTIVITAMIN WITH MINERALS) TABS Take 1 tablet by mouth daily. Reported on 06/12/2015    ? OVER THE COUNTER MEDICATION Zinc 50 mg One tablet daily.    ? OVER THE COUNTER MEDICATION Selenium, 200 mcg. One tablet daily.    ? tadalafil (CIALIS) 5 MG tablet Take 1 tablet (5 mg total) by mouth daily. 90 tablet 3  ? LORazepam (ATIVAN) 0.5 MG tablet Take 1 tablet (0.5 mg total) by mouth every 8 (eight) hours as needed for anxiety. 30 tablet 0  ? ?No facility-administered medications prior to visit.  ? ? ?Allergies  ?Allergen Reactions  ? Penicillins   ?  GI upset ?Has patient had a PCN reaction causing immediate rash, facial/tongue/throat swelling, SOB or lightheadedness with hypotension: No ?Has patient had a PCN reaction causing severe rash involving mucus membranes or skin necrosis: No ?Has patient had a PCN reaction that required hospitalization no ?Has patient had a PCN reaction occurring within the last 10 years: Yes ?If all of the above answers are "NO", then may proceed with Cephalosporin use. ?  ? Sulfa Antibiotics   ?  Hives, joint pain, rash  ? ? ?ROS ?Review of Systems  ?Constitutional:  Negative for appetite change, chills, fever and unexpected weight change.  ?Skin:  Positive for rash.  ? ?  ?Objective:  ?  ?Physical Exam ?Vitals reviewed.   ?Constitutional:   ?   Appearance: Normal appearance.  ?Skin: ?   Findings: Rash present.  ?   Comments: Generally dry skin.  He has a couple small eczematous patches with slightly erythematous base and diffuse scaly surface.  These are about 1/2 cm in diameter and nummular in  shape.  He has 1 on the left anterior thigh and slightly smaller 1 right thigh  ?Neurological:  ?   Mental Status: He is alert.  ? ? ?BP 136/80 (BP Location: Left Arm, Patient Position: Sitting, Cuff Size: Normal)   Pulse 77   Temp (!) 97.5 ?F (36.4 ?C) (Oral)   Ht '5\' 7"'$  (1.702 m)   Wt 175 lb (79.4 kg)   SpO2 97%   BMI 27.41 kg/m?  ?Wt Readings from Last 3 Encounters:  ?05/14/21 175 lb (79.4 kg)  ?04/07/21 169 lb (76.7 kg)  ?10/16/20 181 lb 6.4 oz (82.3 kg)  ? ? ? ?Health Maintenance Due  ?Topic Date Due  ? HIV Screening  Never done  ? Zoster Vaccines- Shingrix (1 of 2) Never done  ? COVID-19 Vaccine (4 - Booster for Pfizer series) 04/04/2020  ? ? ?There are no preventive care reminders to display for this patient. ? ?Lab Results  ?Component Value Date  ? TSH 2.16 04/07/2021  ? ?Lab Results  ?Component Value Date  ? WBC 5.2 04/07/2021  ? HGB 15.6 04/07/2021  ? HCT 47.3 04/07/2021  ? MCV 85.9 04/07/2021  ? PLT 218.0 04/07/2021  ? ?Lab Results  ?Component Value Date  ? NA 141 04/07/2021  ? K 4.1 04/07/2021  ? CO2 35 (H) 04/07/2021  ? GLUCOSE 84 04/07/2021  ? BUN 15 04/07/2021  ? CREATININE 1.06 04/07/2021  ? BILITOT 0.8 04/07/2021  ? ALKPHOS 61 04/07/2021  ? AST 15 04/07/2021  ? ALT 17 04/07/2021  ? PROT 6.7 04/07/2021  ? ALBUMIN 4.6 04/07/2021  ? CALCIUM 9.5 04/07/2021  ? ANIONGAP 6 01/20/2016  ? GFR 75.88 04/07/2021  ? ?Lab Results  ?Component Value Date  ? CHOL 171 04/07/2021  ? ?Lab Results  ?Component Value Date  ? HDL 57.90 04/07/2021  ? ?Lab Results  ?Component Value Date  ? [place] 97 04/07/2021  ? ?Lab Results  ?Component Value Date  ? TRIG 80.0 04/07/2021  ? ?Lab Results  ?Component Value Date  ? CHOLHDL 3 04/07/2021  ? ?No  results found for: HGBA1C ? ?  ?Assessment & Plan:  ? 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  Couple small patches 1 on each thigh.  Does have some generally dry skin as well.  We discussed the following ? ?-Trial of triamcinolone 0.1% cre

## 2021-05-23 ENCOUNTER — Encounter: Payer: Self-pay | Admitting: Family Medicine

## 2021-06-02 ENCOUNTER — Other Ambulatory Visit: Payer: Self-pay

## 2021-06-02 DIAGNOSIS — Z8659 Personal history of other mental and behavioral disorders: Secondary | ICD-10-CM

## 2021-06-02 MED ORDER — SERTRALINE HCL 50 MG PO TABS: 50.0000 mg | ORAL_TABLET | Freq: Every day | ORAL | 2 refills | Status: DC

## 2021-06-23 ENCOUNTER — Other Ambulatory Visit: Payer: Self-pay | Admitting: Family Medicine

## 2021-06-23 DIAGNOSIS — I10 Essential (primary) hypertension: Secondary | ICD-10-CM

## 2021-06-24 ENCOUNTER — Other Ambulatory Visit: Payer: Self-pay | Admitting: Family Medicine

## 2021-06-30 ENCOUNTER — Encounter: Payer: Self-pay | Admitting: Family Medicine

## 2021-06-30 ENCOUNTER — Ambulatory Visit (INDEPENDENT_AMBULATORY_CARE_PROVIDER_SITE_OTHER): Payer: BC Managed Care – PPO | Admitting: Family Medicine

## 2021-06-30 VITALS — BP 130/76 | HR 85 | Temp 97.9°F | Ht 67.0 in | Wt 175.0 lb

## 2021-06-30 DIAGNOSIS — R5383 Other fatigue: Secondary | ICD-10-CM

## 2021-06-30 DIAGNOSIS — J029 Acute pharyngitis, unspecified: Secondary | ICD-10-CM

## 2021-06-30 LAB — CBC WITH DIFFERENTIAL/PLATELET
Basophils Absolute: 0.1 10*3/uL (ref 0.0–0.1)
Basophils Relative: 1.1 % (ref 0.0–3.0)
Eosinophils Absolute: 0.1 10*3/uL (ref 0.0–0.7)
Eosinophils Relative: 2.2 % (ref 0.0–5.0)
HCT: 46.8 % (ref 39.0–52.0)
Hemoglobin: 15.9 g/dL (ref 13.0–17.0)
Lymphocytes Relative: 20 % (ref 12.0–46.0)
Lymphs Abs: 0.9 10*3/uL (ref 0.7–4.0)
MCHC: 34 g/dL (ref 30.0–36.0)
MCV: 86.6 fl (ref 78.0–100.0)
Monocytes Absolute: 0.5 10*3/uL (ref 0.1–1.0)
Monocytes Relative: 11.2 % (ref 3.0–12.0)
Neutro Abs: 3.1 10*3/uL (ref 1.4–7.7)
Neutrophils Relative %: 65.5 % (ref 43.0–77.0)
Platelets: 222 10*3/uL (ref 150.0–400.0)
RBC: 5.41 Mil/uL (ref 4.22–5.81)
RDW: 13 % (ref 11.5–15.5)
WBC: 4.7 10*3/uL (ref 4.0–10.5)

## 2021-06-30 LAB — POCT RAPID STREP A (OFFICE): Rapid Strep A Screen: NEGATIVE

## 2021-06-30 LAB — C-REACTIVE PROTEIN: CRP: <1 mg/dL (ref 0.5–20.0)

## 2021-06-30 LAB — SEDIMENTATION RATE: Sed Rate: 3 mm/hr (ref 0–20)

## 2021-06-30 NOTE — Progress Notes (Signed)
? ?Established Patient Office Visit ? ?Subjective   ?Patient ID: BEATRICE SEHGAL, male    DOB: 08-29-1960  Age: 61 y.o. MRN: [id number] ? ?Chief Complaint  ?Patient presents with  ? Sore  ?  Patient complains of sore throat,x1 month  ? ? ?HPI ? ? ?[name] is seen with roughly 1 month history of sore throat symptoms.  He initially thought this may be allergies.  He has daily soreness with swallowing.  No fever.  No chills.  Has not noted any significant neck adenopathy.  Appetite slightly off but his weight is actually up 6 pounds from last visit.  Denies any cough, dyspnea, or night sweats.  He specifically had concerns about "lymphoma ".  Again, has not noted any specific adenopathy, fever, night sweats, dyspnea, etc. ? ?Quit smoke in 1982.  No oral tobacco use. ? ?Past Medical History:  ?Diagnosis Date  ? Allergy   ? Anxiety   ? Asthma   ?  exercise induced mild no inhaler use  ? Autoimmune disorder ([place])   ? never dx occ causes joint and muscle weakness/soreness  ? Cancer [facility])   ? skin- multiple removed-1 basal cell  2016, 1 squamous cell 2002  ? Chicken pox as child  ? Depression   ? GERD (gastroesophageal reflux disease)   ? Headache   ? History of kidney stones   ? History of migraine 2018  ? none since 2018  ? Hypertension   ? Prostatitis   ? Shingles 2017  ? torso, no deficit from  ? Wears glasses   ? ?Past Surgical History:  ?Procedure Laterality Date  ? COLONOSCOPY  2019  ? CYSTOSCOPY WITH RETROGRADE PYELOGRAM, URETEROSCOPY AND STENT PLACEMENT Bilateral 07/24/2020  ? Procedure: CYSTOSCOPY WITH RETROGRADE PYELOGRAM, URETEROSCOPY AND STENT PLACEMENT;  Surgeon: Alexis Frock, MD;  Location: [facility];  Service: Urology;  Laterality: Bilateral;  105 MINS  ? EXTRACORPOREAL SHOCK WAVE LITHOTRIPSY Right 03/29/2017  ? Procedure: RIGHT EXTRACORPOREAL SHOCK WAVE LITHOTRIPSY (ESWL);  Surgeon: Nickie Retort, MD;  [facility];  Service: Urology;  Laterality: Right;  ? HOLMIUM LASER APPLICATION  Bilateral 3/53/2992  ? Procedure: HOLMIUM LASER APPLICATION;  Surgeon: Alexis Frock, MD;  [facility]: [facility];  Service: Urology;  Laterality: Bilateral;  ? HYDROCELE EXCISION Left 07/24/2020  ? Procedure: HYDROCELECTOMY ADULT;  Surgeon: Alexis Frock, MD;  Location[facility];  Service: Urology;  Laterality: Left;  ? LITHOTRIPSY  last done jan 2019  ? x 6  ? skin cancer removed    ? on ankle, back neck  ? UPPER GASTROINTESTINAL ENDOSCOPY  not sure when  ? ? reports that he quit smoking about 40 years ago. His smoking use included cigarettes. He has a 2.00 pack-year smoking history. He has never used smokeless tobacco. He reports current alcohol use. He reports that he does not use drugs. ?family history includes Cancer in his mother; Cancer (age of onset: 9) in his brother; Cancer (age of onset: 94) in his father; Colon cancer in his father and mother; Diabetes in his maternal grandfather and paternal grandmother; Hypertension in his mother; Rectal cancer in his father. ?Allergies  ?Allergen Reactions  ? Penicillins   ?  GI upset ?Has patient had a PCN reaction causing immediate rash, facial/tongue/throat swelling, SOB or lightheadedness with hypotension: No ?Has patient had a PCN reaction causing severe rash involving mucus membranes or skin necrosis: No ?Has patient had a PCN reaction that required hospitalization no ?Has  patient had a PCN reaction occurring within the last 10 years: Yes ?If all of the above answers are "NO", then may proceed with Cephalosporin use. ?  ? Sulfa Antibiotics   ?  Hives, joint pain, rash  ? ? ?Review of Systems  ?Constitutional:  Positive for malaise/fatigue. Negative for chills, fever and weight loss.  ?HENT:  Positive for sore throat. Negative for ear pain and sinus pain.   ?Respiratory:  Negative for cough and shortness of breath.   ?Cardiovascular:  Negative for chest pain.  ?Gastrointestinal:  Negative for nausea and vomiting.  ?Skin:   Negative for rash.  ? ?  ?Objective:  ?  ? ?BP 130/76 (BP Location: Left Arm, Patient Position: Sitting, Cuff Size: Normal)   Pulse 85   Temp 97.9 ?F (36.6 ?C) (Oral)   Ht '5\' 7"'$  (1.702 m)   Wt 175 lb (79.4 kg)   SpO2 97%   BMI 27.41 kg/m?  ? ? ?Physical Exam ?Vitals reviewed.  ?Constitutional:   ?   Appearance: Normal appearance.  ?HENT:  ?   Right Ear: Tympanic membrane normal.  ?   Left Ear: Tympanic membrane normal.  ?   Mouth/Throat:  ?   Mouth: Mucous membranes are moist.  ?   Pharynx: Oropharynx is clear. No oropharyngeal exudate or posterior oropharyngeal erythema.  ?Neck:  ?   Comments: No supraclavicular adenopathy ?Cardiovascular:  ?   Rate and Rhythm: Normal rate and regular rhythm.  ?Pulmonary:  ?   Effort: Pulmonary effort is normal.  ?   Breath sounds: Normal breath sounds.  ?Abdominal:  ?   Palpations: Abdomen is soft. There is no mass.  ?   Tenderness: There is no abdominal tenderness.  ?Musculoskeletal:  ?   Cervical back: Neck supple.  ?Lymphadenopathy:  ?   Cervical: No cervical adenopathy.  ?Neurological:  ?   Mental Status: He is alert.  ? ? ? ?Results for orders placed or performed in visit on 06/30/21  ?POC Rapid Strep A  ?Result Value Ref Range  ? Rapid Strep A Screen Negative Negative  ? ? ? ? ?The 10-year ASCVD risk score (Arnett DK, et al., 2019) is: 8.8% ? ?  ?Assessment & Plan:  ? ?Problem List Items Addressed This Visit   ?None ?Visit Diagnoses   ? ? Sore throat    -  Primary  ? Relevant Orders  ? POC Rapid Strep A (Completed)  ? Sedimentation rate  ? C-reactive Protein  ? Fatigue, unspecified type      ? Relevant Orders  ? CBC with Differential/Platelet  ? Sedimentation rate  ? C-reactive Protein  ? ?  ?Patient presents with 1 month history of sore throat.  No bacterial sinusitis symptoms.  Symptoms unresolved with over-the-counter allergy treatments.  Does not have any other red flags such as weight loss, night sweats, fever, adenopathy, etc.  Weight is actually up 6 pounds from  last visit.  Patient had concerns about specifically lymphoma. ? ?-Start with CBC with differential, sed rate, C-reactive protein. ?-Continue over-the-counter allergy treatments ?-Follow-up for any new symptoms or persistent symptoms ? ?No follow-ups on file.  ? ? ?Carolann Littler, MD ? ?

## 2021-07-07 ENCOUNTER — Ambulatory Visit: Payer: BC Managed Care – PPO | Admitting: Family Medicine

## 2021-08-01 ENCOUNTER — Encounter: Payer: Self-pay | Admitting: Family Medicine

## 2021-08-01 ENCOUNTER — Ambulatory Visit (INDEPENDENT_AMBULATORY_CARE_PROVIDER_SITE_OTHER): Payer: BC Managed Care – PPO | Admitting: Family Medicine

## 2021-08-01 VITALS — BP 120/68 | HR 82 | Temp 97.6°F | Ht 67.0 in | Wt 173.3 lb

## 2021-08-01 DIAGNOSIS — R1010 Upper abdominal pain, unspecified: Secondary | ICD-10-CM | POA: Diagnosis not present

## 2021-08-01 DIAGNOSIS — M2550 Pain in unspecified joint: Secondary | ICD-10-CM

## 2021-08-01 DIAGNOSIS — L299 Pruritus, unspecified: Secondary | ICD-10-CM | POA: Diagnosis not present

## 2021-08-01 NOTE — Progress Notes (Signed)
Established Patient Office Visit  Subjective   Patient ID: Donald Clark, male    DOB: 01/29/1961  Age: 61 y.o. MRN: [id number]  Chief Complaint  Patient presents with   Abdominal Pain    Patient complains of abdominal pain,x2 months   Abdominal Cramping    HPI   Seen with multiple nonspecific complaints.  Has some intermittent bilateral pruritus which seems to be confined to the lower extremities.  Has had occasional pinpoint papules but no other specific rash.  He also relates some intermittent "joint tenderness "involving mostly knees, ankles, hands.  He feels like he has some intermittent swelling.  Recent sed rate, C-reactive protein, CBC, and other labs are unremarkable.  No known family history of rheumatoid arthritis.  Patient does relate remote history of Lyme disease.  He also wonders if some of this may be post-COVID  He also has issue with intermittent diffuse upper abdominal pain and swelling sometimes for days.  No correlation with specific foods.  No associated nausea or vomiting.  Weight stable.  Appetite fair.  No stool changes.  Last colonoscopy 3 years ago.  Strong family history of cancer with both parents having colorectal cancer and also has a brother with history of kidney cancer.  Past Medical History:  Diagnosis Date   Allergy    Anxiety    Asthma     exercise induced mild no inhaler use   Autoimmune disorder ([place])    never dx occ causes joint and muscle weakness/soreness   Cancer ([place])    skin- multiple removed-1 basal cell  2016, 1 squamous cell 2002   Chicken pox as child   Depression    GERD (gastroesophageal reflux disease)    Headache    History of kidney stones    History of migraine 2018   none since 2018   Hypertension    Prostatitis    Shingles 2017   torso, no deficit from   Wears glasses    Past Surgical History:  Procedure Laterality Date   COLONOSCOPY  2019   CYSTOSCOPY WITH RETROGRADE PYELOGRAM, URETEROSCOPY AND STENT PLACEMENT  Bilateral 07/24/2020   Procedure: CYSTOSCOPY WITH RETROGRADE PYELOGRAM, URETEROSCOPY AND STENT PLACEMENT;  Surgeon: Alexis Frock, MD;  Location: [facility];  Service: Urology;  Laterality: Bilateral;  [place] LITHOTRIPSY Right 03/29/2017   Procedure: RIGHT EXTRACORPOREAL SHOCK WAVE LITHOTRIPSY (ESWL);  Surgeon: Nickie Retort, MD;  Location[facility];  Service: Urology;  Laterality: Right;   HOLMIUM LASER APPLICATION Bilateral 06/08/8117   Procedure: HOLMIUM LASER APPLICATION;  Surgeon: Alexis Frock, MD;  Location: [facility];  Service: Urology;  Laterality: Bilateral;   HYDROCELE EXCISION Left 07/24/2020   Procedure: HYDROCELECTOMY ADULT;  Surgeon: Alexis Frock, MD;  [facility];  Service: Urology;  Laterality: Left;   LITHOTRIPSY  last done jan 2019   x 6   skin cancer removed     on ankle, back neck   UPPER GASTROINTESTINAL ENDOSCOPY  not sure when    reports that he quit smoking about 40 years ago. His smoking use included cigarettes. He has a 2.00 pack-year smoking history. He has never used smokeless tobacco. He reports current alcohol use. He reports that he does not use drugs. family history includes Cancer in his mother; Cancer (age of onset: 53) in his brother; Cancer (age of onset: 24) in his father; Colon cancer in his father and mother; Diabetes in his maternal grandfather  and paternal grandmother; Hypertension in his mother; Rectal cancer in his father. Allergies  Allergen Reactions   Penicillins     GI upset Has patient had a PCN reaction causing immediate rash, facial/tongue/throat swelling, SOB or lightheadedness with hypotension: No Has patient had a PCN reaction causing severe rash involving mucus membranes or skin necrosis: No Has patient had a PCN reaction that required hospitalization no Has patient had a PCN reaction occurring within the last 10 years: Yes If all of the above  answers are "NO", then may proceed with Cephalosporin use.    Sulfa Antibiotics     Hives, joint pain, rash    Review of Systems  Constitutional:  Positive for malaise/fatigue. Negative for chills, fever and weight loss.  Respiratory:  Negative for shortness of breath.   Cardiovascular:  Negative for chest pain.  Gastrointestinal:  Positive for abdominal pain.  Genitourinary:  Negative for dysuria.  Musculoskeletal:  Positive for joint pain.  Neurological:  Negative for dizziness.     Objective:     BP 120/68 (BP Location: Left Arm, Patient Position: Sitting, Cuff Size: Normal)   Pulse 82   Temp 97.6 F (36.4 C) (Oral)   Ht '5\' 7"'$  (1.702 m)   Wt 173 lb 4.8 oz (78.6 kg)   SpO2 99%   BMI 27.14 kg/m    Physical Exam Vitals reviewed.  Constitutional:      General: He is not in acute distress.    Appearance: He is well-developed. He is not ill-appearing.  HENT:     Head: Normocephalic and atraumatic.  Cardiovascular:     Rate and Rhythm: Normal rate and regular rhythm.     Heart sounds: No murmur heard. Pulmonary:     Effort: Pulmonary effort is normal.     Breath sounds: Normal breath sounds.  Abdominal:     Tenderness: There is no abdominal tenderness.     Comments: Normal bowel sounds.  Nondistended.  Soft and nontender with no masses palpated.  No guarding or rebound.  Skin:    Findings: No rash.  Neurological:     Mental Status: He is alert.     No results found for any visits on 08/01/21.    The 10-year ASCVD risk score (Arnett DK, et al., 2019) is: 7.7%    Assessment & Plan:   Problem List Items Addressed This Visit   None Visit Diagnoses     Polyarthralgia    -  Primary   Relevant Orders   ANA   Cyclic citrul peptide antibody, IgG   Rheumatoid Factor   Pruritus       Relevant Orders   Galactose-alpha-1,3-galactose IgE   Upper abdominal pain         Patient presents with multiple symptoms as above.  Nonfocal exam.  He questions alpha gal  allergy.  He has not had any recurrent hives but has described intermittent swelling of hands and feet and pruritus.  Infrequent consumption of red meats.  He also complains of arthralgias intermittently.  This seems to come on fairly acutely.  Etiology unclear.  Recent sed rate and C-reactive protein unremarkable.  -Check further labs with alpha gal panel, ANA, CCP, rheumatoid factor. -Watch for more specific symptoms such as fever, vomiting, localizing abdominal pain, etc. -Consider GI referral versus upper abdominal imaging for recurrent abdominal symptoms.  His recent symptoms did not sound like they were likely gallbladder related.  There was no localization of the right upper quadrant or radiation toward the  back or any associated nausea  No follow-ups on file.    Carolann Littler, MD

## 2021-08-06 LAB — ANA: Anti Nuclear Antibody (ANA): POSITIVE — AB

## 2021-08-06 LAB — ALPHA-GAL PANEL
Allergen, Mutton, f88: <0.1 kU/L
Allergen, Pork, f26: <0.1 kU/L
Beef: <0.1 kU/L
CLASS: 0
CLASS: 0
Class: 0
GALACTOSE-ALPHA-1,3-GALACTOSE IGE*: <0.1 kU/L (ref <0.10)

## 2021-08-06 LAB — ANTI-NUCLEAR AB-TITER (ANA TITER): ANA Titer 1: 1:40 {titer} — ABNORMAL HIGH

## 2021-08-06 LAB — CYCLIC CITRUL PEPTIDE ANTIBODY, IGG: Cyclic Citrullin Peptide Ab: <16 UNITS

## 2021-08-06 LAB — RHEUMATOID FACTOR: Rheumatoid fact SerPl-aCnc: <14 IU/mL (ref <14)

## 2021-08-06 LAB — INTERPRETATION:

## 2021-08-11 ENCOUNTER — Ambulatory Visit: Payer: BC Managed Care – PPO | Admitting: Family Medicine

## 2021-08-13 ENCOUNTER — Encounter: Payer: Self-pay | Admitting: Family Medicine

## 2021-08-13 ENCOUNTER — Ambulatory Visit: Payer: BC Managed Care – PPO | Admitting: Family Medicine

## 2021-08-13 VITALS — BP 126/62 | HR 88 | Temp 97.8°F | Ht 67.0 in | Wt 176.2 lb

## 2021-08-13 DIAGNOSIS — R768 Other specified abnormal immunological findings in serum: Secondary | ICD-10-CM

## 2021-08-13 DIAGNOSIS — M2550 Pain in unspecified joint: Secondary | ICD-10-CM

## 2021-08-13 NOTE — Progress Notes (Signed)
Established Patient Office Visit  Subjective   Patient ID: Donald Clark, male    DOB: 1960-07-26  Age: 61 y.o. MRN: [id number]  Chief Complaint  Patient presents with   Results    HPI   Donald Clark is seen for follow-up regarding some recent polyarthralgia issues.  Refer to note from 08-01-2021 for details.  Also has had some intermittent bilateral pruritus.  Occasional small bruises forearms.  No petechiae.  We did obtain recent labs including sed rate, C-reactive protein, CBC which were unremarkable.  Chemistries unremarkable.  Patient had question whether some of this was post-COVID related.  He has some arthralgias involving knees, ankles, hands but no visible signs of inflammation such as erythema, visible swelling, or warmth.  He had specifically requested alpha gal testing and this was done last visit and came back negative.  His labs were negative except for ANA with titer 1:40.  He is aware this is a very nonspecific finding.  He does not have any skin rashes.  Arthralgias are actually somewhat better at this time.  Past Medical History:  Diagnosis Date   Allergy    Anxiety    Asthma     exercise induced mild no inhaler use   Autoimmune disorder ([place])    never dx occ causes joint and muscle weakness/soreness   Cancer ([place])    skin- multiple removed-1 basal cell  2016, 1 squamous cell 2002   Chicken pox as child   Depression    GERD (gastroesophageal reflux disease)    Headache    History of kidney stones    History of migraine 2018   none since 2018   Hypertension    Prostatitis    Shingles 2017   torso, no deficit from   Wears glasses    Past Surgical History:  Procedure Laterality Date   COLONOSCOPY  2019   CYSTOSCOPY WITH RETROGRADE PYELOGRAM, URETEROSCOPY AND STENT PLACEMENT Bilateral 07/24/2020   Procedure: CYSTOSCOPY WITH RETROGRADE PYELOGRAM, URETEROSCOPY AND STENT PLACEMENT;  Surgeon: Alexis Frock, MD;  Location: [facility];  Service:  Urology;  Laterality: Bilateral;  [place] LITHOTRIPSY Right 03/29/2017   Procedure: RIGHT EXTRACORPOREAL SHOCK WAVE LITHOTRIPSY (ESWL);  Surgeon: Nickie Retort, MD;  Location: [facility];  Service: Urology;  Laterality: Right;   HOLMIUM LASER APPLICATION Bilateral 6/76/1950   Procedure: HOLMIUM LASER APPLICATION;  Surgeon: Alexis Frock, MD;  Location: [facility];  Service: Urology;  Laterality: Bilateral;   HYDROCELE EXCISION Left 07/24/2020   Procedure: HYDROCELECTOMY ADULT;  Surgeon: Alexis Frock, MD;  Location: [facility];  Service: Urology;  Laterality: Left;   LITHOTRIPSY  last done jan 2019   x 6   skin cancer removed     on ankle, back neck   UPPER GASTROINTESTINAL ENDOSCOPY  not sure when    reports that he quit smoking about 40 years ago. His smoking use included cigarettes. He has a 2.00 pack-year smoking history. He has never used smokeless tobacco. He reports current alcohol use. He reports that he does not use drugs. family history includes Cancer in his mother; Cancer (age of onset: 49) in his brother; Cancer (age of onset: 37) in his father; Colon cancer in his father and mother; Diabetes in his maternal grandfather and paternal grandmother; Hypertension in his mother; Rectal cancer in his father. Allergies  Allergen Reactions   Penicillins     GI upset Has patient had a PCN reaction causing  immediate rash, facial/tongue/throat swelling, SOB or lightheadedness with hypotension: No Has patient had a PCN reaction causing severe rash involving mucus membranes or skin necrosis: No Has patient had a PCN reaction that required hospitalization no Has patient had a PCN reaction occurring within the last 10 years: Yes If all of the above answers are "NO", then may proceed with Cephalosporin use.    Sulfa Antibiotics     Hives, joint pain, rash    Review of Systems  Constitutional:  Negative for chills, fever and  weight loss.  HENT:  Positive for sore throat.   Respiratory:  Negative for sputum production.   Cardiovascular:  Negative for chest pain.  Gastrointestinal:  Negative for abdominal pain.      Objective:     BP 126/62 (BP Location: Left Arm, Patient Position: Sitting, Cuff Size: Normal)   Pulse 88   Temp 97.8 F (36.6 C) (Oral)   Ht '5\' 7"'$  (1.702 m)   Wt 176 lb 3.2 oz (79.9 kg)   SpO2 99%   BMI 27.60 kg/m    Physical Exam Vitals reviewed.  Constitutional:      Appearance: Normal appearance.  HENT:     Mouth/Throat:     Mouth: Mucous membranes are moist.     Pharynx: Oropharynx is clear. No oropharyngeal exudate.  Cardiovascular:     Rate and Rhythm: Normal rate and regular rhythm.     Heart sounds: No murmur heard. Pulmonary:     Effort: Pulmonary effort is normal. No respiratory distress.     Breath sounds: Normal breath sounds.  Musculoskeletal:     Cervical back: Neck supple.     Right lower leg: No edema.     Left lower leg: No edema.  Lymphadenopathy:     Cervical: No cervical adenopathy.  Neurological:     General: No focal deficit present.     Mental Status: He is alert.      No results found for any visits on 08/13/21.    The 10-year ASCVD risk score (Arnett DK, et al., 2019) is: 8.4%    Assessment & Plan:   Problem List Items Addressed This Visit   None Visit Diagnoses     Positive ANA (antinuclear antibody)    -  Primary   Arthralgia, unspecified joint         We discussed his recent labs in some detail.  We did discuss the fact that mild elevations or low titers of ANA are very nonspecific.  We discussed possible rheumatology referral but at this point his symptoms are slightly improved and we recommended observation.  Some of his symptoms may have been postviral.  He does not have any objective evidence for joint inflammation at this time and recent sed rate and C-reactive protein were reassuring.  Follow-up for any recurrent symptoms or  other concerns especially new symptoms such as skin rash, fever, etc.  No follow-ups on file.    Carolann Littler, MD

## 2021-09-05 ENCOUNTER — Other Ambulatory Visit: Payer: Self-pay | Admitting: Family Medicine

## 2021-09-05 DIAGNOSIS — Z8659 Personal history of other mental and behavioral disorders: Secondary | ICD-10-CM

## 2021-09-23 ENCOUNTER — Other Ambulatory Visit: Payer: Self-pay | Admitting: Family Medicine

## 2021-10-09 ENCOUNTER — Encounter: Payer: Self-pay | Admitting: Family Medicine

## 2021-10-10 ENCOUNTER — Other Ambulatory Visit: Payer: Self-pay

## 2021-10-10 DIAGNOSIS — M2550 Pain in unspecified joint: Secondary | ICD-10-CM

## 2021-10-14 ENCOUNTER — Ambulatory Visit: Payer: BC Managed Care – PPO | Admitting: Family Medicine

## 2021-10-14 VITALS — BP 136/72 | HR 89 | Temp 98.1°F | Ht 67.0 in | Wt 176.8 lb

## 2021-10-14 DIAGNOSIS — K068 Other specified disorders of gingiva and edentulous alveolar ridge: Secondary | ICD-10-CM

## 2021-10-14 DIAGNOSIS — R748 Abnormal levels of other serum enzymes: Secondary | ICD-10-CM

## 2021-10-14 DIAGNOSIS — R040 Epistaxis: Secondary | ICD-10-CM | POA: Diagnosis not present

## 2021-10-14 DIAGNOSIS — Q048 Other specified congenital malformations of brain: Secondary | ICD-10-CM | POA: Diagnosis not present

## 2021-10-14 DIAGNOSIS — R519 Headache, unspecified: Secondary | ICD-10-CM

## 2021-10-14 DIAGNOSIS — R2689 Other abnormalities of gait and mobility: Secondary | ICD-10-CM

## 2021-10-14 LAB — CBC WITH DIFFERENTIAL/PLATELET
Basophils Absolute: 0 10*3/uL (ref 0.0–0.1)
Basophils Relative: 1 % (ref 0.0–3.0)
Eosinophils Absolute: 0.1 10*3/uL (ref 0.0–0.7)
Eosinophils Relative: 3.1 % (ref 0.0–5.0)
HCT: 46.1 % (ref 39.0–52.0)
Hemoglobin: 15.8 g/dL (ref 13.0–17.0)
Lymphocytes Relative: 29.3 % (ref 12.0–46.0)
Lymphs Abs: 1.3 10*3/uL (ref 0.7–4.0)
MCHC: 34.3 g/dL (ref 30.0–36.0)
MCV: 84.7 fl (ref 78.0–100.0)
Monocytes Absolute: 0.4 10*3/uL (ref 0.1–1.0)
Monocytes Relative: 8 % (ref 3.0–12.0)
Neutro Abs: 2.6 10*3/uL (ref 1.4–7.7)
Neutrophils Relative %: 58.6 % (ref 43.0–77.0)
Platelets: 227 10*3/uL (ref 150.0–400.0)
RBC: 5.44 Mil/uL (ref 4.22–5.81)
RDW: 12.6 % (ref 11.5–15.5)
WBC: 4.5 10*3/uL (ref 4.0–10.5)

## 2021-10-14 LAB — PROTIME-INR
INR: 1 ratio (ref 0.8–1.0)
Prothrombin Time: 11.2 s (ref 9.6–13.1)

## 2021-10-14 LAB — COMPREHENSIVE METABOLIC PANEL
ALT: 60 U/L — ABNORMAL HIGH (ref 0–53)
AST: 41 U/L — ABNORMAL HIGH (ref 0–37)
Albumin: 4.7 g/dL (ref 3.5–5.2)
Alkaline Phosphatase: 74 U/L (ref 39–117)
BUN: 17 mg/dL (ref 6–23)
CO2: 32 mEq/L (ref 19–32)
Calcium: 9.9 mg/dL (ref 8.4–10.5)
Chloride: 101 mEq/L (ref 96–112)
Creatinine, Ser: 1.1 mg/dL (ref 0.40–1.50)
GFR: 72.32 mL/min (ref >60.00)
Glucose, Bld: 135 mg/dL — ABNORMAL HIGH (ref 70–99)
Potassium: 3.5 mEq/L (ref 3.5–5.1)
Sodium: 141 mEq/L (ref 135–145)
Total Bilirubin: 0.2 mg/dL (ref 0.2–1.2)
Total Protein: 7.3 g/dL (ref 6.0–8.3)

## 2021-10-14 LAB — MICROALBUMIN / CREATININE URINE RATIO
Creatinine,U: 207.5 mg/dL
Microalb Creat Ratio: 0.5 mg/g (ref 0.0–30.0)
Microalb, Ur: 1.1 mg/dL (ref 0.0–1.9)

## 2021-10-14 LAB — APTT: aPTT: 36.7 s (ref 25.4–36.8)

## 2021-10-14 NOTE — Progress Notes (Signed)
Established Patient Office Visit  Subjective   Patient ID: Donald Clark, male    DOB: 12-02-1960  Age: 61 y.o. MRN: [id number]  Chief Complaint  Patient presents with   Headache    Patient complains of headaches, x2 weeks    Epistaxis    Patient complains of nosebleeds at night, x2 weeks     HPI   [name] is seen today companied by wife with several issues.  He has been feeling suboptimally for some time.  He had had some arthralgias back couple months ago and lab testing at that point fairly unremarkable except for low titer of positive ANA.  He recently went on some Zoloft and felt like that was helping his mood symptoms but developed some nosebleed and read that SSRIs can be associate with that.  He has had some recurrent nosebleed right side.  Occasional gum bleeding.  He has had some bruising in the past but none currently.  No petechiae.  No hematuria or hematochezia. Does not take any aspirin or other antiplatelet medications or blood thinners.  He also states he has felt somewhat off balance recently.  Has had headaches past couple weeks which are almost daily and mostly bifrontal.  Some sinus congestion.  Has felt like he has been less sharp cognitively.  No urine or stool incontinence.  No focal weakness.  No recent syncope.  No recent head injury.  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    Past Medical History:  Diagnosis Date   Allergy    Anxiety    Asthma     exercise induced mild no inhaler use   Autoimmune disorder ([place])    never dx occ causes joint and muscle weakness/soreness   Cancer ([place])    skin- multiple removed-1 basal cell  2016, 1 squamous cell 2002   Chicken pox as child   Depression    GERD (gastroesophageal reflux disease)    Headache    History of kidney stones    History of migraine 2018   none since 2018   Hypertension    Prostatitis    Shingles  2017   torso, no deficit from   Wears glasses    Past Surgical History:  Procedure Laterality Date   COLONOSCOPY  2019   CYSTOSCOPY WITH RETROGRADE PYELOGRAM, URETEROSCOPY AND STENT PLACEMENT Bilateral 07/24/2020   Procedure: CYSTOSCOPY WITH RETROGRADE PYELOGRAM, URETEROSCOPY AND STENT PLACEMENT;  Surgeon: Alexis Frock, MD;  Location: [facility];  Service: Urology;  Laterality: Bilateral;  [place] LITHOTRIPSY Right 03/29/2017   Procedure: RIGHT EXTRACORPOREAL SHOCK WAVE LITHOTRIPSY (ESWL);  Surgeon: Nickie Retort, MD;  Location[facility];  Service: Urology;  Laterality: Right;   HOLMIUM LASER APPLICATION Bilateral 2/77/8242   Procedure: HOLMIUM LASER APPLICATION;  Surgeon: Alexis Frock, MD;  Location[facility];  Service: Urology;  Laterality: Bilateral;   HYDROCELE EXCISION Left 07/24/2020   Procedure: HYDROCELECTOMY ADULT;  Surgeon: Alexis Frock, MD;  Location: [facility];  Service: Urology;  Laterality: Left;   LITHOTRIPSY  last done jan 2019   x 6   skin cancer removed     on ankle, back neck   UPPER GASTROINTESTINAL ENDOSCOPY  not sure when    reports that he quit smoking about 41 years ago. His smoking use included cigarettes. He has a 2.00 pack-year smoking history. He has  never used smokeless tobacco. He reports current alcohol use. He reports that he does not use drugs. family history includes Cancer in his mother; Cancer (age of onset: 5) in his brother; Cancer (age of onset: 60) in his father; Colon cancer in his father and mother; Diabetes in his maternal grandfather and paternal grandmother; Hypertension in his mother; Rectal cancer in his father. Allergies  Allergen Reactions   Penicillins     GI upset Has patient had a PCN reaction causing immediate rash, facial/tongue/throat swelling, SOB or lightheadedness with hypotension: No Has patient had a PCN reaction causing severe rash  involving mucus membranes or skin necrosis: No Has patient had a PCN reaction that required hospitalization no Has patient had a PCN reaction occurring within the last 10 years: Yes If all of the above answers are "NO", then may proceed with Cephalosporin use.    Sulfa Antibiotics     Hives, joint pain, rash    Review of Systems  Constitutional:  Positive for malaise/fatigue. Negative for chills and fever.  Eyes:  Negative for blurred vision.  Respiratory:  Negative for shortness of breath.   Cardiovascular:  Negative for chest pain.  Neurological:  Positive for headaches. Negative for tremors, speech change, focal weakness, seizures and loss of consciousness.  Endo/Heme/Allergies:  Bruises/bleeds easily.      Objective:     BP 136/72 (BP Location: Left Arm, Patient Position: Sitting, Cuff Size: Normal)   Pulse 89   Temp 98.1 F (36.7 C) (Oral)   Ht '5\' 7"'$  (1.702 m)   Wt 176 lb 12.8 oz (80.2 kg)   SpO2 98%   BMI 27.69 kg/m  BP Readings from Last 3 Encounters:  10/14/21 136/72  08/13/21 126/62  08/01/21 120/68   Wt Readings from Last 3 Encounters:  10/14/21 176 lb 12.8 oz (80.2 kg)  08/13/21 176 lb 3.2 oz (79.9 kg)  08/01/21 173 lb 4.8 oz (78.6 kg)      Physical Exam Vitals reviewed.  Constitutional:      Appearance: He is well-developed.  HENT:     Head: Normocephalic and atraumatic.     Comments: Nasal exam reveals no clotted blood or active bleeding.  He has slightly prominent superficial vessels right anterior nasal septum.  Appears slightly dry.  No specific lesions noted. Eyes:     Extraocular Movements: Extraocular movements intact.  Cardiovascular:     Rate and Rhythm: Normal rate and regular rhythm.     Heart sounds: No murmur heard.    No gallop.  Pulmonary:     Effort: Pulmonary effort is normal.     Breath sounds: Normal breath sounds.  Neurological:     Mental Status: He is alert.     Cranial Nerves: No cranial nerve deficit or facial asymmetry.      Motor: No weakness.     Coordination: Coordination normal.     Gait: Gait normal.  Psychiatric:        Mood and Affect: Mood normal.      No results found for any visits on 10/14/21.  Last CBC Lab Results  Component Value Date   WBC 4.7 06/30/2021   HGB 15.9 06/30/2021   HCT 46.8 06/30/2021   MCV 86.6 06/30/2021   MCH 28.5 01/20/2016   RDW 13.0 06/30/2021   PLT 222.0 66/29/4765   Last metabolic panel Lab Results  Component Value Date   GLUCOSE 84 04/07/2021   NA 141 04/07/2021   K 4.1 04/07/2021   CL 101  04/07/2021   CO2 35 (H) 04/07/2021   BUN 15 04/07/2021   CREATININE 1.06 04/07/2021   GFRNONAA >60 01/20/2016   CALCIUM 9.5 04/07/2021   PROT 6.7 04/07/2021   ALBUMIN 4.6 04/07/2021   BILITOT 0.8 04/07/2021   ALKPHOS 61 04/07/2021   AST 15 04/07/2021   ALT 17 04/07/2021   ANIONGAP 6 01/20/2016   Last lipids Lab Results  Component Value Date   CHOL 171 04/07/2021   HDL 57.90 04/07/2021   LDLCALC 97 04/07/2021   TRIG 80.0 04/07/2021   CHOLHDL 3 04/07/2021   Last thyroid functions Lab Results  Component Value Date   TSH 2.16 04/07/2021   Last vitamin B12 and Folate No results found for: "VITAMINB12", "FOLATE"    The 10-year ASCVD risk score (Arnett DK, et al., 2019) is: 9.6%    Assessment & Plan:   #1 recent epistaxis right side and reported gum bleeding intermittently.  He has some easy bruising in recent months but none noted at this time.  Recent right nosebleed appears to be anterior.  No lesions noted.  No aspirin use. -Check CBC, PT, PTT -Keep right naris moist as possible  #2 frequent bilateral headache.  He had some other issues including that he feels frequently off balance.  He had MRI scan about 5 years ago which showed "giant cisterna magna "in the posterior fossa along with mild lateral ventricular system dilatation.  In view of his recent headaches and balance issues obtain follow-up MRI to further assess -Avoid daily use of  analgesics -Follow-up for any progression in headache severity or new symptoms   No follow-ups on file.    Carolann Littler, MD

## 2021-10-16 ENCOUNTER — Encounter: Payer: Self-pay | Admitting: Neurology

## 2021-10-17 NOTE — Addendum Note (Signed)
Addended by: Nilda Riggs on: 10/17/2021 08:38 AM   Modules accepted: Orders

## 2021-10-28 ENCOUNTER — Ambulatory Visit
Admission: RE | Admit: 2021-10-28 | Discharge: 2021-10-28 | Disposition: A | Discharge status: Home / Self Care | Payer: BC Managed Care – PPO | Source: Ambulatory Visit | Attending: Family Medicine | Admitting: Family Medicine

## 2021-10-28 DIAGNOSIS — R519 Headache, unspecified: Secondary | ICD-10-CM

## 2021-10-28 DIAGNOSIS — Q048 Other specified congenital malformations of brain: Secondary | ICD-10-CM

## 2021-10-28 DIAGNOSIS — R2689 Other abnormalities of gait and mobility: Secondary | ICD-10-CM

## 2021-11-24 ENCOUNTER — Other Ambulatory Visit (INDEPENDENT_AMBULATORY_CARE_PROVIDER_SITE_OTHER): Payer: BC Managed Care – PPO

## 2021-11-24 DIAGNOSIS — R748 Abnormal levels of other serum enzymes: Secondary | ICD-10-CM | POA: Diagnosis not present

## 2021-11-24 LAB — HEPATIC FUNCTION PANEL
ALT: 18 U/L (ref 0–53)
AST: 19 U/L (ref 0–37)
Albumin: 4.5 g/dL (ref 3.5–5.2)
Alkaline Phosphatase: 58 U/L (ref 39–117)
Bilirubin, Direct: 0.1 mg/dL (ref 0.0–0.3)
Total Bilirubin: 0.6 mg/dL (ref 0.2–1.2)
Total Protein: 7 g/dL (ref 6.0–8.3)

## 2021-12-19 ENCOUNTER — Other Ambulatory Visit: Payer: Self-pay | Admitting: Family Medicine

## 2021-12-19 DIAGNOSIS — I10 Essential (primary) hypertension: Secondary | ICD-10-CM

## 2021-12-20 ENCOUNTER — Other Ambulatory Visit: Payer: Self-pay | Admitting: Family Medicine

## 2022-03-09 ENCOUNTER — Other Ambulatory Visit: Payer: Self-pay | Admitting: Family Medicine

## 2022-03-09 DIAGNOSIS — Z8659 Personal history of other mental and behavioral disorders: Secondary | ICD-10-CM

## 2022-03-16 NOTE — Progress Notes (Unsigned)
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  No further workup warranted.  No further recommendations at this time.  He will continue to stay away from corn products.  If he starts having recurrence of spells, he will follow up.     Subjective:  Donald Clark is a 62 year old male with hypertension, anxiety and history of skin (basal cell and squamous cell) carcinoma and kidney stones who presents for headache and large cisterna magna.  History supplemented by referring provider's note.  About 20 years ago, he started having episodes of weakness and soreness of his arms and legs.  It was proposed that he may have MS.  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  They seemed to have calm down for a while but the episodes started becoming more frequent over the past year, occurring about every couple of weeks and lasting 3 to 4 days.  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  He then realized that he would experience these spells after eating [place] food or corn-based pizza.  Suspecting that it may be a corn allergy, he removed corn from his  diet.  He has not had a spell in 6 months.  Additionally, he sometimes have brief episodes of vertigo associated with blurred vision in the right eye.  Sometimes associated with mild headache.  May last 2 hours or even minutes.  Occurs once every couple of weeks.  They are manageable.  He has prior history of migraines.  His daughter has migraines.   Past NSAIDS/analgesics:  ketorolac Past abortive triptans:  none Past abortive ergotamine:  none Past muscle relaxants:  cyclobenzaprine, methocarbamol Past anti-emetic:  none Past antihypertensive medications:  metoprolol Past antidepressant medications:  nortriptyline '10mg'$ , sertraline Past anticonvulsant medications:  topiramate ER Past anti-CGRP:  none  Current NSAIDS/analgesics:  none Current triptans:  none Current ergotamine:  none Current anti-emetic:  none Current muscle relaxants:  none Current Antihypertensive medications:  amlodipine Current Antidepressant medications:  bupropion XL '300mg'$  Current Anticonvulsant medications:  none Current anti-CGRP:  none Current Vitamins/Herbal/Supplements:  none Current Antihistamines/Decongestants:  loratadine        PAST MEDICAL HISTORY: Past Medical History:  Diagnosis Date   Allergy    Anxiety    Asthma     exercise induced mild no inhaler use   Autoimmune disorder ([place])    never dx occ causes joint and muscle weakness/soreness   Cancer ([place])    skin- multiple removed-1 basal cell  2016, 1 squamous cell 2002   Chicken pox as child   Depression    GERD (gastroesophageal reflux disease)    Headache    History of kidney stones    History of migraine 2018   none since 2018  Hypertension    Prostatitis    Shingles 2017   torso, no deficit from   Wears glasses     PAST SURGICAL HISTORY: Past Surgical History:  Procedure Laterality Date   COLONOSCOPY  2019   CYSTOSCOPY WITH RETROGRADE PYELOGRAM, URETEROSCOPY AND STENT PLACEMENT Bilateral 07/24/2020   Procedure:  CYSTOSCOPY WITH RETROGRADE PYELOGRAM, URETEROSCOPY AND STENT PLACEMENT;  Surgeon: Alexis Frock, MD;  Location: [facility];  Service: Urology;  Laterality: Bilateral;  [place] LITHOTRIPSY Right 03/29/2017   Procedure: RIGHT EXTRACORPOREAL SHOCK WAVE LITHOTRIPSY (ESWL);  Surgeon: Nickie Retort, MD;  Location[facility];  Service: Urology;  Laterality: Right;   HOLMIUM LASER APPLICATION Bilateral 07/18/8414   Procedure: HOLMIUM LASER APPLICATION;  Surgeon: Alexis Frock, MD;  Location: [facility];  Service: Urology;  Laterality: Bilateral;   HYDROCELE EXCISION Left 07/24/2020   Procedure: HYDROCELECTOMY ADULT;  Surgeon: Alexis Frock, MD;  Location: [facility];  Service: Urology;  Laterality: Left;   LITHOTRIPSY  last done jan 2019   x 6   skin cancer removed     on ankle, back neck   UPPER GASTROINTESTINAL ENDOSCOPY  not sure when    MEDICATIONS: Current Outpatient Medications on File Prior to Visit  Medication Sig Dispense Refill   amLODipine (NORVASC) 10 MG tablet TAKE 1 TABLET BY MOUTH EVERY DAY 90 tablet 1   buPROPion (WELLBUTRIN XL) 300 MG 24 hr tablet TAKE 1 TABLET BY MOUTH EVERY DAY 90 tablet 0   indapamide (LOZOL) 2.5 MG tablet Take 2.5 mg by mouth daily.     lansoprazole (PREVACID SOLUTAB) 15 MG disintegrating tablet Take 15 mg by mouth daily at 12 noon.     loratadine (CLARITIN) 10 MG tablet Take 10 mg by mouth daily.     Multiple Vitamin (MULTIVITAMIN WITH MINERALS) TABS Take 1 tablet by mouth daily. Reported on 06/12/2015     sertraline (ZOLOFT) 50 MG tablet TAKE 1 TABLET BY MOUTH EVERY DAY 90 tablet 1   tadalafil (CIALIS) 5 MG tablet Take 1 tablet (5 mg total) by mouth daily. 90 tablet 3   triamcinolone cream (KENALOG) 0.1 % Apply 1 application. topically 2 (two) times daily as needed. 30 g 1   No current facility-administered medications on file prior to visit.    ALLERGIES: Allergies   Allergen Reactions   Penicillins     GI upset Has patient had a PCN reaction causing immediate rash, facial/tongue/throat swelling, SOB or lightheadedness with hypotension: No Has patient had a PCN reaction causing severe rash involving mucus membranes or skin necrosis: No Has patient had a PCN reaction that required hospitalization no Has patient had a PCN reaction occurring within the last 10 years: Yes If all of the above answers are "NO", then may proceed with Cephalosporin use.    Sulfa Antibiotics     Hives, joint pain, rash    FAMILY HISTORY: Family History  Problem Relation Age of Onset   Cancer Mother        colon, uterine CA   Hypertension Mother    Colon cancer Mother    Cancer Father 55       colon cancer   Rectal cancer Father    Colon cancer Father    Diabetes Maternal Grandfather    Diabetes Paternal Grandmother    Cancer Brother 34       kidney cancer   Esophageal cancer Neg Hx    Stomach cancer Neg Hx  Ulcerative colitis Neg Hx     Objective:  Blood pressure 126/82, pulse 97, height '5\' 7"'$  (1.702 m), weight 177 lb 9.6 oz (80.6 kg), SpO2 97 %. General: No acute distress.  Patient appears well-groomed.   Head:  Normocephalic/atraumatic Eyes:  fundi examined but not visualized Neck: supple, no paraspinal tenderness, full range of motion Back: No paraspinal tenderness Heart: regular rate and rhythm Lungs: Clear to auscultation bilaterally. Vascular: No carotid bruits. Neurological Exam: Mental status: alert and oriented to person, place, and time, speech fluent and not dysarthric, language intact. Cranial nerves: CN I: not tested CN II: pupils equal, round and reactive to light, visual fields intact CN III, IV, VI:  full range of motion, no nystagmus, no ptosis CN V: facial sensation intact. CN VII: upper and lower face symmetric CN VIII: hearing intact CN IX, X: gag intact, uvula midline CN XI: sternocleidomastoid and trapezius muscles intact CN  XII: tongue midline Bulk & Tone: normal, no fasciculations. Motor:  muscle strength 5/5 throughout Sensation:  Pinprick, temperature and vibratory sensation intact. Deep Tendon Reflexes:  2+ throughout,  toes downgoing.   Finger to nose testing:  Without dysmetria.   Heel to shin:  Without dysmetria.   Gait:  Normal station and stride.  Romberg negative.    Thank you for allowing me to take part in the care of this patient.  Metta Clines, DO  CC: Carolann Littler, MD

## 2022-03-17 ENCOUNTER — Encounter: Payer: Self-pay | Admitting: Family Medicine

## 2022-03-17 ENCOUNTER — Encounter: Payer: Self-pay | Admitting: Neurology

## 2022-03-17 ENCOUNTER — Ambulatory Visit: Payer: BC Managed Care – PPO | Admitting: Family Medicine

## 2022-03-17 ENCOUNTER — Ambulatory Visit: Payer: BC Managed Care – PPO | Admitting: Neurology

## 2022-03-17 VITALS — BP 118/74 | HR 78 | Temp 98.8°F | Ht 67.0 in | Wt 173.4 lb

## 2022-03-17 VITALS — BP 126/82 | HR 97 | Ht 67.0 in | Wt 177.6 lb

## 2022-03-17 DIAGNOSIS — R531 Weakness: Secondary | ICD-10-CM

## 2022-03-17 DIAGNOSIS — L989 Disorder of the skin and subcutaneous tissue, unspecified: Secondary | ICD-10-CM

## 2022-03-17 DIAGNOSIS — Z23 Encounter for immunization: Secondary | ICD-10-CM

## 2022-03-17 DIAGNOSIS — L820 Inflamed seborrheic keratosis: Secondary | ICD-10-CM | POA: Diagnosis not present

## 2022-03-17 DIAGNOSIS — G43109 Migraine with aura, not intractable, without status migrainosus: Secondary | ICD-10-CM | POA: Diagnosis not present

## 2022-03-17 DIAGNOSIS — Z0000 Encounter for general adult medical examination without abnormal findings: Secondary | ICD-10-CM

## 2022-03-17 DIAGNOSIS — M546 Pain in thoracic spine: Secondary | ICD-10-CM | POA: Diagnosis not present

## 2022-03-17 DIAGNOSIS — Q049 Congenital malformation of brain, unspecified: Secondary | ICD-10-CM

## 2022-03-17 DIAGNOSIS — D229 Melanocytic nevi, unspecified: Secondary | ICD-10-CM

## 2022-03-17 MED ORDER — CYCLOBENZAPRINE HCL 5 MG PO TABS: ORAL_TABLET | ORAL | 1 refills | Status: DC

## 2022-03-17 NOTE — Progress Notes (Signed)
Established Patient Office Visit  Subjective   Patient ID: Donald Clark, male    DOB: [birth year]  Age: 62 y.o. MRN: [id number]  Chief Complaint  Patient presents with   Back Pain    Patient complains of back pain, x2 months, Tried Tylenol with little relief    Skin Problem    HPI   Donald Clark is seen for the following issues  Irritated skin lesion left side of neck.  Present for several years but recently changing.  For several months he has noticed some crusting on 1 edge.  Also irritating because of location and prone to local trauma.  He also has intermittent back pain mostly thoracic area.  He thinks this may be back muscle spasms.  His wife had some Flexeril which she took a couple times at low dosage which seemed to help.  He would like prescription for this.  No spinal tenderness.  This is more paraspinal.  Feels like a muscle tightening.  Past Medical History:  Diagnosis Date   Allergy    Anxiety    Asthma     exercise induced mild no inhaler use   Autoimmune disorder ([place])    never dx occ causes joint and muscle weakness/soreness   Cancer ([place])    skin- multiple removed-1 basal cell  2016, 1 squamous cell 2002   Chicken pox as child   Depression    GERD (gastroesophageal reflux disease)    Headache    History of kidney stones    History of migraine 2018   none since 2018   Hypertension    Prostatitis    Shingles 2017   torso, no deficit from   Wears glasses    Past Surgical History:  Procedure Laterality Date   COLONOSCOPY  2019   CYSTOSCOPY WITH RETROGRADE PYELOGRAM, URETEROSCOPY AND STENT PLACEMENT Bilateral 07/24/2020   Procedure: CYSTOSCOPY WITH RETROGRADE PYELOGRAM, URETEROSCOPY AND STENT PLACEMENT;  Surgeon: Alexis Frock, MD;  Location: [facility];  Service: Urology;  Laterality: Bilateral;  [place] LITHOTRIPSY Right 03/29/2017   Procedure: RIGHT EXTRACORPOREAL SHOCK WAVE LITHOTRIPSY (ESWL);  Surgeon:  Nickie Retort, MD;  Location[facility];  Service: Urology;  Laterality: Right;   HOLMIUM LASER APPLICATION Bilateral 03/20/4172   Procedure: HOLMIUM LASER APPLICATION;  Surgeon: Alexis Frock, MD;  Location: [facility];  Service: Urology;  Laterality: Bilateral;   HYDROCELE EXCISION Left 07/24/2020   Procedure: HYDROCELECTOMY ADULT;  Surgeon: Alexis Frock, MD;  Location: [facility];  Service: Urology;  Laterality: Left;   LITHOTRIPSY  last done jan 2019   x 6   skin cancer removed     on ankle, back neck   UPPER GASTROINTESTINAL ENDOSCOPY  not sure when    reports that he quit smoking about 41 years ago. His smoking use included cigarettes. He has a 2.00 pack-year smoking history. He has never used smokeless tobacco. He reports current alcohol use. He reports that he does not use drugs. family history includes Cancer in his mother; Cancer (age of onset: 13) in his brother; Cancer (age of onset: 25) in his father; Colon cancer in his father and mother; Diabetes in his maternal grandfather and paternal grandmother; Hypertension in his mother; Migraines in his daughter; Rectal cancer in his father. Allergies  Allergen Reactions   Penicillins     GI upset Has patient had a PCN reaction causing immediate rash, facial/tongue/throat swelling, SOB or lightheadedness with hypotension: No  Has patient had a PCN reaction causing severe rash involving mucus membranes or skin necrosis: No Has patient had a PCN reaction that required hospitalization no Has patient had a PCN reaction occurring within the last 10 years: Yes If all of the above answers are "NO", then may proceed with Cephalosporin use.    Sulfa Antibiotics     Hives, joint pain, rash    Review of Systems  Constitutional:  Negative for chills, fever and weight loss.  Musculoskeletal:  Positive for back pain.      Objective:     BP 118/74 (BP Location: Left Arm, Patient Position: Sitting, Cuff  Size: Normal)   Pulse 78   Temp 98.8 F (37.1 C) (Oral)   Ht '5\' 7"'$  (1.702 m)   Wt 173 lb 6.4 oz (78.7 kg)   SpO2 98%   BMI 27.16 kg/m  BP Readings from Last 3 Encounters:  03/17/22 118/74  03/17/22 126/82  10/14/21 136/72   Wt Readings from Last 3 Encounters:  03/17/22 173 lb 6.4 oz (78.7 kg)  03/17/22 177 lb 9.6 oz (80.6 kg)  10/14/21 176 lb 12.8 oz (80.2 kg)      Physical Exam Vitals reviewed.  Cardiovascular:     Rate and Rhythm: Normal rate and regular rhythm.  Pulmonary:     Effort: Pulmonary effort is normal.     Breath sounds: Normal breath sounds.  Skin:    Comments: Side of neck reveals skin lesion which is fleshy in color to slightly brownish with 1 crusted area near the periphery.  This is approximately 6 mm diameter.  Neurological:     Mental Status: He is alert.      No results found for any visits on 03/17/22.    The 10-year ASCVD risk score (Arnett DK, et al., 2019) is: 8.2%    Assessment & Plan:   #1 irritated skin lesion left side of neck.  Question irritated seborrheic keratosis.  Patient would like to have removed because of irritation and also recent changes.  Discussed risks and benefits of shave excision including risks of bleeding, bruising, scar formation, and infection and patient consented to proceed.  Anesthesia with 1% xylocaine with epinephrine.  Skin prepped with betadine and alcohol and shave excision of lesion with #15 blade with minimal bleeding.  Bleeding controlled with Monsel's solution.  Patient tolerated well.  Vaseline and dressing  applied. -Specimen sent to [facility] pathology for further evaluation -Follow-up promptly for any signs of secondary infection -Wound care discussed with handout given  #2 intermittent thoracic back pain.  Suspect muscular.  Flexeril 5 mg 1-2 every 8 hours as needed for muscle spasm.  Continue stretches.  He is aware that Flexeril can be sedating  He has physical scheduled for February and would like  to get labs in advance.  We did place future order for labs but have asked that he confirm with his insurance company to make sure they cover this    No follow-ups on file.    Carolann Littler, MD

## 2022-03-17 NOTE — Patient Instructions (Signed)
Keep wound dry for the first 24 hours then clean daily with soap and water for one week. Apply vaseline daily for 4-5 days. Keep covered with clean dressing for 4-5 days. Follow up promptly for any signs of infection such as redness, warmth, pain, or drainage. We will call when derm path is back.

## 2022-03-17 NOTE — Addendum Note (Signed)
Addended by: Nilda Riggs on: 03/17/2022 11:45 AM   Modules accepted: Orders

## 2022-04-17 ENCOUNTER — Ambulatory Visit (INDEPENDENT_AMBULATORY_CARE_PROVIDER_SITE_OTHER): Payer: BC Managed Care – PPO | Admitting: Family Medicine

## 2022-04-17 ENCOUNTER — Encounter: Payer: Self-pay | Admitting: Family Medicine

## 2022-04-17 DIAGNOSIS — Z0000 Encounter for general adult medical examination without abnormal findings: Secondary | ICD-10-CM | POA: Diagnosis not present

## 2022-04-17 LAB — CBC WITH DIFFERENTIAL/PLATELET
Basophils Absolute: 0 10*3/uL (ref 0.0–0.1)
Basophils Relative: 0.7 % (ref 0.0–3.0)
Eosinophils Absolute: 0.1 10*3/uL (ref 0.0–0.7)
Eosinophils Relative: 2.9 % (ref 0.0–5.0)
HCT: 46.8 % (ref 39.0–52.0)
Hemoglobin: 16.1 g/dL (ref 13.0–17.0)
Lymphocytes Relative: 22.3 % (ref 12.0–46.0)
Lymphs Abs: 0.9 10*3/uL (ref 0.7–4.0)
MCHC: 34.5 g/dL (ref 30.0–36.0)
MCV: 84.8 fl (ref 78.0–100.0)
Monocytes Absolute: 0.4 10*3/uL (ref 0.1–1.0)
Monocytes Relative: 8.7 % (ref 3.0–12.0)
Neutro Abs: 2.7 10*3/uL (ref 1.4–7.7)
Neutrophils Relative %: 65.4 % (ref 43.0–77.0)
Platelets: 207 10*3/uL (ref 150.0–400.0)
RBC: 5.51 Mil/uL (ref 4.22–5.81)
RDW: 13.1 % (ref 11.5–15.5)
WBC: 4.1 10*3/uL (ref 4.0–10.5)

## 2022-04-17 LAB — BASIC METABOLIC PANEL
BUN: 16 mg/dL (ref 6–23)
CO2: 31 mEq/L (ref 19–32)
Calcium: 9.7 mg/dL (ref 8.4–10.5)
Chloride: 102 mEq/L (ref 96–112)
Creatinine, Ser: 1.1 mg/dL (ref 0.40–1.50)
GFR: 72.06 mL/min (ref >60.00)
Glucose, Bld: 86 mg/dL (ref 70–99)
Potassium: 4.2 mEq/L (ref 3.5–5.1)
Sodium: 141 mEq/L (ref 135–145)

## 2022-04-17 LAB — HEPATIC FUNCTION PANEL
ALT: 17 U/L (ref 0–53)
AST: 14 U/L (ref 0–37)
Albumin: 4.6 g/dL (ref 3.5–5.2)
Alkaline Phosphatase: 67 U/L (ref 39–117)
Bilirubin, Direct: 0.2 mg/dL (ref 0.0–0.3)
Total Bilirubin: 0.7 mg/dL (ref 0.2–1.2)
Total Protein: 6.7 g/dL (ref 6.0–8.3)

## 2022-04-17 LAB — PSA: PSA: 1.35 ng/mL (ref 0.10–4.00)

## 2022-04-17 LAB — LIPID PANEL
Cholesterol: 178 mg/dL (ref 0–200)
HDL: 59 mg/dL (ref >39.00)
LDL Cholesterol: 106 mg/dL — ABNORMAL HIGH (ref 0–99)
NonHDL: 118.5
Total CHOL/HDL Ratio: 3
Triglycerides: 62 mg/dL (ref 0.0–149.0)
VLDL: 12.4 mg/dL (ref 0.0–40.0)

## 2022-04-17 MED ORDER — TADALAFIL 5 MG PO TABS: 5.0000 mg | ORAL_TABLET | Freq: Every day | ORAL | 3 refills | Status: AC

## 2022-04-17 NOTE — Patient Instructions (Signed)
Consider Shingrix vaccine at some point this year.

## 2022-04-17 NOTE — Progress Notes (Signed)
Established Patient Office Visit  Subjective   Patient ID: Donald Clark, male    DOB: 11/29/1960  Age: 62 y.o. MRN: [id number]  Chief Complaint  Patient presents with   Annual Exam    HPI   Donald Clark is seen for physical exam.  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  He has hypertension which is controlled.  Past history of kidney stones.  Strong family history of colon cancer as below in both parents.  He does have history of BPH which is controlled with daily Cialis.  Requesting refill.  He had recent scaly lesion excised left side of neck which came back irritated seborrheic keratosis.  Has healed well.  Health maintenance reviewed  -No history of shingles vaccine.  He will consider.  He has had shingles previously -Colonoscopy due next year -Tetanus due 2032 -Prior hepatitis C screen negative -Flu vaccine already given  Family history-both parents had colon cancer and both passed away from metastatic colon cancer.  They are both in their early 43s on their diagnosis.  He had a brother with some type of rare renal cancer.  None of his siblings have had colon cancer.  Social history he is married.  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  He has a daughter who lives in [place] and works in social work.  Son who finished up Therapist, occupational and plans to start PhD program at [employer].  Quit smoking 1982.  Occasional beer use.  Past Medical History:  Diagnosis Date   Allergy    Anxiety    Asthma     exercise induced mild no inhaler use   Autoimmune disorder ([place])    never dx occ causes joint and muscle weakness/soreness   Cancer ([place])    skin- multiple removed-1 basal cell  2016, 1 squamous cell 2002   Chicken pox as child   Depression    GERD (gastroesophageal reflux disease)    Headache    History of kidney stones    History of migraine 2018   none since 2018   Hypertension     Prostatitis    Shingles 2017   torso, no deficit from   Wears glasses    Past Surgical History:  Procedure Laterality Date   COLONOSCOPY  2019   CYSTOSCOPY WITH RETROGRADE PYELOGRAM, URETEROSCOPY AND STENT PLACEMENT Bilateral 07/24/2020   Procedure: CYSTOSCOPY WITH RETROGRADE PYELOGRAM, URETEROSCOPY AND STENT PLACEMENT;  Surgeon: Alexis Frock, MD;  Location: [facility];  Service: Urology;  Laterality: Bilateral;  [place] LITHOTRIPSY Right 03/29/2017   Procedure: RIGHT EXTRACORPOREAL SHOCK WAVE LITHOTRIPSY (ESWL);  Surgeon: Nickie Retort, MD;  [facility];  Service: Urology;  Laterality: Right;   HOLMIUM LASER APPLICATION Bilateral 0000000   Procedure: HOLMIUM LASER APPLICATION;  Surgeon: Alexis Frock, MD;  Location: [facility];  Service: Urology;  Laterality: Bilateral;   HYDROCELE EXCISION Left 07/24/2020   Procedure: HYDROCELECTOMY ADULT;  Surgeon: Alexis Frock, MD;  Location: [facility];  Service: Urology;  Laterality: Left;   LITHOTRIPSY  last done jan 2019   x 6   skin cancer removed     on ankle, back neck   UPPER GASTROINTESTINAL ENDOSCOPY  not sure when    reports that he quit smoking about 41 years ago. His smoking use included cigarettes. He has a 2.00 pack-year smoking history. He has  never used smokeless tobacco. He reports current alcohol use. He reports that he does not use drugs. family history includes Cancer in his mother; Cancer (age of onset: 17) in his brother; Cancer (age of onset: 69) in his father; Colon cancer in his father and mother; Diabetes in his maternal grandfather and paternal grandmother; Hypertension in his mother; Migraines in his daughter; Rectal cancer in his father. Allergies  Allergen Reactions   Penicillins     GI upset Has patient had a PCN reaction causing immediate rash, facial/tongue/throat swelling, SOB or lightheadedness with hypotension: No Has  patient had a PCN reaction causing severe rash involving mucus membranes or skin necrosis: No Has patient had a PCN reaction that required hospitalization no Has patient had a PCN reaction occurring within the last 10 years: Yes If all of the above answers are "NO", then may proceed with Cephalosporin use.    Sulfa Antibiotics     Hives, joint pain, rash       Review of Systems  Constitutional:  Negative for chills, fever, malaise/fatigue and weight loss.  HENT:  Negative for hearing loss.   Eyes:  Negative for blurred vision and double vision.  Respiratory:  Negative for cough and shortness of breath.   Cardiovascular:  Negative for chest pain, palpitations and leg swelling.  Gastrointestinal:  Negative for abdominal pain, blood in stool, constipation and diarrhea.  Genitourinary:  Negative for dysuria.  Skin:  Negative for rash.  Neurological:  Negative for dizziness, speech change, seizures, loss of consciousness and headaches.  Psychiatric/Behavioral:  Negative for depression.       Objective:     BP 120/70 (BP Location: Left Arm, Patient Position: Sitting, Cuff Size: Normal)   Pulse 80   Temp 98 F (36.7 C) (Oral)   Ht 5' 6.93" (1.7 m)   Wt 174 lb (78.9 kg)   SpO2 98%   BMI 27.31 kg/m  BP Readings from Last 3 Encounters:  04/17/22 120/70  03/17/22 118/74  03/17/22 126/82   Wt Readings from Last 3 Encounters:  04/17/22 174 lb (78.9 kg)  03/17/22 173 lb 6.4 oz (78.7 kg)  03/17/22 177 lb 9.6 oz (80.6 kg)      Physical Exam Vitals reviewed.  Constitutional:      General: He is not in acute distress.    Appearance: He is well-developed.  HENT:     Head: Normocephalic and atraumatic.     Right Ear: External ear normal.     Left Ear: External ear normal.  Eyes:     Conjunctiva/sclera: Conjunctivae normal.     Pupils: Pupils are equal, round, and reactive to light.  Neck:     Thyroid: No thyromegaly.  Cardiovascular:     Rate and Rhythm: Normal rate and  regular rhythm.     Heart sounds: Normal heart sounds. No murmur heard. Pulmonary:     Effort: No respiratory distress.     Breath sounds: No wheezing or rales.  Abdominal:     General: Bowel sounds are normal. There is no distension.     Palpations: Abdomen is soft. There is no mass.     Tenderness: There is no abdominal tenderness. There is no guarding or rebound.  Musculoskeletal:     Cervical back: Normal range of motion and neck supple.     Right lower leg: No edema.     Left lower leg: No edema.  Lymphadenopathy:     Cervical: No cervical adenopathy.  Skin:  Findings: No rash.  Neurological:     Mental Status: He is alert and oriented to person, place, and time.     Cranial Nerves: No cranial nerve deficit.      No results found for any visits on 04/17/22.    The 10-year ASCVD risk score (Arnett DK, et al., 2019) is: 8.4%    Assessment & Plan:   Physical exam.  Generally doing well.  History of BPH which is controlled with Cialis.  Hypertension which is well-controlled.  -Future labs placed which he will get today -Refill Cialis 5 mg daily for 1 year -Discussed Shingrix vaccine which he will consider -Repeat colonoscopy next year -Continue annual flu vaccine -Recommend try to establish more consistent exercise.  He is looking at possible change in work schedule next year which would allow up more time for exercise during the week  Carolann Littler, MD

## 2022-05-12 ENCOUNTER — Other Ambulatory Visit: Payer: Self-pay | Admitting: Family Medicine

## 2022-06-03 ENCOUNTER — Ambulatory Visit: Payer: BC Managed Care – PPO | Admitting: Family Medicine

## 2022-06-03 ENCOUNTER — Encounter: Payer: Self-pay | Admitting: Family Medicine

## 2022-06-03 VITALS — BP 132/88 | HR 95 | Temp 98.3°F | Wt 175.0 lb

## 2022-06-03 DIAGNOSIS — K4090 Unilateral inguinal hernia, without obstruction or gangrene, not specified as recurrent: Secondary | ICD-10-CM

## 2022-06-03 NOTE — Progress Notes (Signed)
   Subjective:    Patient ID: Donald Clark, male    DOB: October 04, 1960, 62 y.o.   MRN: [id number]  HPI Here for 5 weeks of intermittent sharp pains in the left lower abdomen and left testicle. He says this began when he was lifting a heavy ceramic urn, and he felt a pulling sensation as he did this. No problems with urinating or BM's. He is S/P a hydrocele repair in the left scrotum two years ago.    Review of Systems  Constitutional: Negative.   Respiratory: Negative.    Cardiovascular: Negative.   Gastrointestinal:  Positive for abdominal pain. Negative for abdominal distention, blood in stool, constipation, diarrhea, nausea, rectal pain and vomiting.  Genitourinary:  Positive for testicular pain. Negative for dysuria, frequency, hematuria and urgency.       Objective:   Physical Exam Constitutional:      Appearance: Normal appearance. He is not ill-appearing.  Cardiovascular:     Rate and Rhythm: Normal rate and regular rhythm.     Pulses: Normal pulses.     Heart sounds: Normal heart sounds.  Pulmonary:     Effort: Pulmonary effort is normal.     Breath sounds: Normal breath sounds.  Abdominal:     General: Abdomen is flat. Bowel sounds are normal. There is no distension.     Palpations: Abdomen is soft.     Tenderness: There is no guarding or rebound.     Comments: He is tender over the left inguinal area and there is an indirect left inguinal hernia   Neurological:     Mental Status: He is alert.           Assessment & Plan:  Inguinal hernia. We will refer him to Surgery to evaluate . Alysia Penna, MD

## 2022-06-26 ENCOUNTER — Ambulatory Visit: Payer: Self-pay | Admitting: Surgery

## 2022-06-26 NOTE — H&P (Signed)
Subjective    Chief Complaint: New Consultation and Hernia   Urology-Dr. Dwana Curd   History of Present Illness: Donald Clark is a 62 y.o. male who is seen today as an office consultation at the request of Dr. Clent Ridges for evaluation of New Consultation and Hernia .   This is a 62 year old male who is 2 years status post left hydrocelectomy by Dr. Berneice Heinrich.  Patient also has recurrent kidney stones and has undergone multiple lithotripsy as well as cystoscopy laser treatments.  He presents now with discomfort in the left lower abdomen radiating down to his groin.  This started after a recent episode where he was lifting a large flowerpot that he purchased at [employer].  The pain in the left lower abdomen has resolved.  He does not really have pain in his groin but he had does have pain radiating into his testicle into the base of his penis near his scrotal incision.  He denies any obstructive symptoms.   The patient is accompanied by his wife.  She reports to me that he had significant bleeding issues after the hydrocelectomy and also after a recent lithotripsy treatment.  He had blood work performed as part of his annual physical in February of this year.  No sign of anemia, thrombocytopenia, or liver dysfunction.  INR was checked in August 2023 and was normal.  He underwent a rheumatologic workup last year and this was positive only for low-level ANA.  No further treatment was recommended at that time.     Review of Systems: A complete review of systems was obtained from the patient.  I have reviewed this information and discussed as appropriate with the patient.  See HPI as well for other ROS.   Review of Systems  Constitutional: Negative.   HENT:  Positive for congestion.   Eyes:  Positive for redness.  Respiratory: Negative.    Cardiovascular: Negative.   Gastrointestinal: Negative.   Genitourinary: Negative.   Musculoskeletal: Negative.   Skin: Negative.   Neurological: Negative.    Endo/Heme/Allergies:  Bruises/bleeds easily.  Psychiatric/Behavioral: Negative.          Medical History: Past Medical History      Past Medical History:  Diagnosis Date   Anxiety     GERD (gastroesophageal reflux disease)     Hypertension          Problem List     Patient Active Problem List  Diagnosis   Chest pain   Family history of colon cancer   GERD (gastroesophageal reflux disease)   History of depression   Hypertension   Kidney stones        Past Surgical History       Past Surgical History:  Procedure Laterality Date   kidney stone removal       LITHOTRIPSY            Allergies       Allergies  Allergen Reactions   Penicillins Other (See Comments)      GI upset  Has patient had a PCN reaction causing immediate rash, facial/tongue/throat swelling, SOB or lightheadedness with hypotension: No  Has patient had a PCN reaction causing severe rash involving mucus membranes or skin necrosis: No  Has patient had a PCN reaction that required hospitalization no  Has patient had a PCN reaction occurring within the last 10 years: Yes  If all of the above answers are "NO", then may proceed with Cephalosporin use.   GI upset, Has patient had a  PCN reaction causing immediate rash, facial/tongue/throat swelling, SOB or lightheadedness with hypotension: No, Has patient had a PCN reaction causing severe rash involving mucus membranes or skin necrosis: No, Has patient had a PCN reaction that required hospitalization no, Has patient had a PCN reaction occurring within the last 10 years: Yes, If all of the above answers are "NO", then may proceed with Cephalosporin use.   Sulfa (Sulfonamide Antibiotics) Rash      Hives, joint pain, rash        Medications Ordered Prior to Encounter        Current Outpatient Medications on File Prior to Visit  Medication Sig Dispense Refill   amLODIPine (NORVASC) 10 MG tablet Take 1 tablet by mouth once daily       buPROPion  (WELLBUTRIN XL) 300 MG XL tablet Take 1 tablet by mouth once daily       cyclobenzaprine (FLEXERIL) 5 MG tablet Take one to two tablets by mouth every 8 hours as needed for muscle spasm       indapamide (LOZOL) 2.5 MG tablet Take 2.5 mg by mouth once daily       pediatric multivitamin chewable tablet Take by mouth       tadalafiL (CIALIS) 5 MG tablet Take 5 mg by mouth once daily        No current facility-administered medications on file prior to visit.        Family History       Family History  Problem Relation Age of Onset   High blood pressure (Hypertension) Mother     Colon cancer Mother     Colon cancer Father          Tobacco Use History  Social History        Tobacco Use  Smoking Status Former   Types: Cigarettes  Smokeless Tobacco Never        Social History  Social History         Socioeconomic History   Marital status: Married  Tobacco Use   Smoking status: Former      Types: Cigarettes   Smokeless tobacco: Never  Substance and Sexual Activity   Alcohol use: Yes   Drug use: Never    Social Determinants of Health        Financial Resource Strain: Low Risk  (10/14/2021)    Received from [facility], [place]    Overall Financial Resource Strain (CARDIA)     Difficulty of Paying Living Expenses: Not hard at all  Food Insecurity: No Food Insecurity (10/14/2021)    Received from [facility], [place]    Hunger Vital Sign     Worried About Running Out of Food in the Last Year: Never true     Ran Out of Food in the Last Year: Never true  Transportation Needs: No Transportation Needs (10/14/2021)    Received from [facility], [place]    PRAPARE - Transportation     Lack of Transportation (Medical): No     Lack of Transportation (Non-Medical): No  Physical Activity: Insufficiently Active (10/14/2021)    Received from [facility],     Exercise Vital Sign     Days of Exercise per Week: 1 day     Minutes of Exercise per Session: 20  min  Stress: Stress Concern Present (10/14/2021)    Received from [facility], [facility]    [facility] - Occupational Stress Questionnaire     Feeling  of Stress : To some extent  Social Connections: Unknown (10/14/2021)    Received from [facility], [place]    Social Connection and Isolation Panel [NHANES]     Frequency of Communication with Friends and Family: Once a week     Frequency of Social Gatherings with Friends and Family: Once a week     Attends Religious Services: 1 to 4 times per year     Marital Status: Married        Objective:         Vitals:    06/26/22 1003  BP: 136/80  Pulse: 83  Temp: 36.8 C (98.2 F)  SpO2: 98%  Weight: 78.5 kg (173 lb)  Height: 170.2 cm (5\' 7" )  PainSc: 0-No pain    Body mass index is 27.1 kg/m.   Physical Exam    Constitutional:  WDWN in NAD, conversant, no obvious deformities; lying in bed comfortably Eyes:  Pupils equal, round; sclera anicteric; moist conjunctiva; no lid lag HENT:  Oral mucosa moist; good dentition  Neck:  No masses palpated, trachea midline; no thyromegaly Lungs:  CTA bilaterally; normal respiratory effort CV:  Regular rate and rhythm; no murmurs; extremities well-perfused with no edema Abd:  +bowel sounds, soft, non-tender, no palpable organomegaly; no palpable hernias Musc: Normal gait; no apparent clubbing or cyanosis in extremities GU: Bilateral descended testes, no testicular masses, healed scrotal incision, palpable reducible left inguinal hernia, no sign of right inguinal hernia. Lymphatic:  No palpable cervical or axillary lymphadenopathy Skin:  Warm, dry; no sign of jaundice Psychiatric - alert and oriented x 4; calm mood and affect     Assessment and Plan:  Diagnoses and all orders for this visit:   Left inguinal hernia     Recommend left inguinal hernia repair with mesh.  The surgical procedure has been discussed with the patient.  Potential risks, benefits,  alternative treatments, and expected outcomes have been explained.  All of the patient's questions at this time have been answered.  The likelihood of reaching the patient's treatment goal is good.  The patient understand the proposed surgical procedure and wishes to proceed.   The patient wants to wait a couple of months before scheduling surgery.  Prior to surgery, we will repeat labs to make sure that we did not detect any bleeding disorder.       Mauri Temkin Delbert Harness, MD  06/26/2022 11:39 AM

## 2022-08-24 ENCOUNTER — Other Ambulatory Visit: Payer: Self-pay | Admitting: Family Medicine

## 2022-08-24 DIAGNOSIS — I10 Essential (primary) hypertension: Secondary | ICD-10-CM

## 2022-09-08 ENCOUNTER — Other Ambulatory Visit: Payer: Self-pay

## 2022-09-08 ENCOUNTER — Encounter (HOSPITAL_BASED_OUTPATIENT_CLINIC_OR_DEPARTMENT_OTHER): Payer: Self-pay | Admitting: Surgery

## 2022-09-10 ENCOUNTER — Encounter (HOSPITAL_BASED_OUTPATIENT_CLINIC_OR_DEPARTMENT_OTHER)
Admission: RE | Admit: 2022-09-10 | Discharge: 2022-09-10 | Disposition: A | Discharge status: Home / Self Care | Payer: BC Managed Care – PPO | Source: Ambulatory Visit | Attending: Orthopedic Surgery | Admitting: Orthopedic Surgery

## 2022-09-10 DIAGNOSIS — Z01810 Encounter for preprocedural cardiovascular examination: Secondary | ICD-10-CM | POA: Diagnosis not present

## 2022-09-10 DIAGNOSIS — I10 Essential (primary) hypertension: Secondary | ICD-10-CM | POA: Diagnosis not present

## 2022-09-10 NOTE — Progress Notes (Signed)

## 2022-09-15 NOTE — H&P (Signed)
Subjective    Chief Complaint: New Consultation and Hernia   Urology-Dr. Dwana Curd   History of Present Illness: Donald Clark is a 62 y.o. male who is seen today as an office consultation at the request of Dr. Clent Ridges for evaluation of New Consultation and Hernia .   This is a 62 year old male who is 2 years status post left hydrocelectomy by Dr. Berneice Heinrich.  Patient also has recurrent kidney stones and has undergone multiple lithotripsy as well as cystoscopy laser treatments.  He presents now with discomfort in the left lower abdomen radiating down to his groin.  This started after a recent episode where he was lifting a large flowerpot that he purchased at [employer].  The pain in the left lower abdomen has resolved.  He does not really have pain in his groin but he had does have pain radiating into his testicle into the base of his penis near his scrotal incision.  He denies any obstructive symptoms.   The patient is accompanied by his wife.  She reports to me that he had significant bleeding issues after the hydrocelectomy and also after a recent lithotripsy treatment.  He had blood work performed as part of his annual physical in February of this year.  No sign of anemia, thrombocytopenia, or liver dysfunction.  INR was checked in August 2023 and was normal.  He underwent a rheumatologic workup last year and this was positive only for low-level ANA.  No further treatment was recommended at that time.     Review of Systems: A complete review of systems was obtained from the patient.  I have reviewed this information and discussed as appropriate with the patient.  See HPI as well for other ROS.   Review of Systems  Constitutional: Negative.   HENT:  Positive for congestion.   Eyes:  Positive for redness.  Respiratory: Negative.    Cardiovascular: Negative.   Gastrointestinal: Negative.   Genitourinary: Negative.   Musculoskeletal: Negative.   Skin: Negative.   Neurological: Negative.    Endo/Heme/Allergies:  Bruises/bleeds easily.  Psychiatric/Behavioral: Negative.          Medical History: Past Medical History         Past Medical History:  Diagnosis Date   Anxiety     GERD (gastroesophageal reflux disease)     Hypertension          Problem List       Patient Active Problem List  Diagnosis   Chest pain   Family history of colon cancer   GERD (gastroesophageal reflux disease)   History of depression   Hypertension   Kidney stones        Past Surgical History           Past Surgical History:  Procedure Laterality Date   kidney stone removal       LITHOTRIPSY            Allergies           Allergies  Allergen Reactions   Penicillins Other (See Comments)      GI upset  Has patient had a PCN reaction causing immediate rash, facial/tongue/throat swelling, SOB or lightheadedness with hypotension: No  Has patient had a PCN reaction causing severe rash involving mucus membranes or skin necrosis: No  Has patient had a PCN reaction that required hospitalization no  Has patient had a PCN reaction occurring within the last 10 years: Yes  If all of the above answers are "NO", then  may proceed with Cephalosporin use.   GI upset, Has patient had a PCN reaction causing immediate rash, facial/tongue/throat swelling, SOB or lightheadedness with hypotension: No, Has patient had a PCN reaction causing severe rash involving mucus membranes or skin necrosis: No, Has patient had a PCN reaction that required hospitalization no, Has patient had a PCN reaction occurring within the last 10 years: Yes, If all of the above answers are "NO", then may proceed with Cephalosporin use.   Sulfa (Sulfonamide Antibiotics) Rash      Hives, joint pain, rash        Medications Ordered Prior to Encounter             Current Outpatient Medications on File Prior to Visit  Medication Sig Dispense Refill   amLODIPine (NORVASC) 10 MG tablet Take 1 tablet by mouth once daily        buPROPion (WELLBUTRIN XL) 300 MG XL tablet Take 1 tablet by mouth once daily       cyclobenzaprine (FLEXERIL) 5 MG tablet Take one to two tablets by mouth every 8 hours as needed for muscle spasm       indapamide (LOZOL) 2.5 MG tablet Take 2.5 mg by mouth once daily       pediatric multivitamin chewable tablet Take by mouth       tadalafiL (CIALIS) 5 MG tablet Take 5 mg by mouth once daily        No current facility-administered medications on file prior to visit.        Family History           Family History  Problem Relation Age of Onset   High blood pressure (Hypertension) Mother     Colon cancer Mother     Colon cancer Father          Tobacco Use History  Social History           Tobacco Use  Smoking Status Former   Types: Cigarettes  Smokeless Tobacco Never        Social History  Social History             Socioeconomic History   Marital status: Married  Tobacco Use   Smoking status: Former      Types: Cigarettes   Smokeless tobacco: Never  Substance and Sexual Activity   Alcohol use: Yes   Drug use: Never    Social Determinants of Health           Financial Resource Strain: Low Risk  (10/14/2021)    Received from [facility], [place]    Overall Financial Resource Strain (CARDIA)     Difficulty of Paying Living Expenses: Not hard at all  Food Insecurity: No Food Insecurity (10/14/2021)    Received from [facility], [place]    Hunger Vital Sign     Worried About Running Out of Food in the Last Year: Never true     Ran Out of Food in the Last Year: Never true  Transportation Needs: No Transportation Needs (10/14/2021)    Received from [facility], [place]    PRAPARE - Transportation     Lack of Transportation (Medical): No     Lack of Transportation (Non-Medical): No  Physical Activity: Insufficiently Active (10/14/2021)    Received from [facility], [place]    Exercise Vital Sign     Days of Exercise per Week: 1 day     Minutes of  Exercise per Session: 20 min  Stress: Stress Concern Present (10/14/2021)    Received from [facility], [facility]    [facility] of Occupational [facility] - Occupational Stress Questionnaire     Feeling of Stress : To some extent  Social Connections: Unknown (10/14/2021)    Received from [facility], [place]    Social Connection and Isolation Panel [NHANES]     Frequency of Communication with Friends and Family: Once a week     Frequency of Social Gatherings with Friends and Family: Once a week     Attends Religious Services: 1 to 4 times per year     Marital Status: Married        Objective:           Vitals:    06/26/22 1003  BP: 136/80  Pulse: 83  Temp: 36.8 C (98.2 F)  SpO2: 98%  Weight: 78.5 kg (173 lb)  Height: 170.2 cm (5\' 7" )  PainSc: 0-No pain    Body mass index is 27.1 kg/m.   Physical Exam    Constitutional:  WDWN in NAD, conversant, no obvious deformities; lying in bed comfortably Eyes:  Pupils equal, round; sclera anicteric; moist conjunctiva; no lid lag HENT:  Oral mucosa moist; good dentition  Neck:  No masses palpated, trachea midline; no thyromegaly Lungs:  CTA bilaterally; normal respiratory effort CV:  Regular rate and rhythm; no murmurs; extremities well-perfused with no edema Abd:  +bowel sounds, soft, non-tender, no palpable organomegaly; no palpable hernias Musc: Normal gait; no apparent clubbing or cyanosis in extremities GU: Bilateral descended testes, no testicular masses, healed scrotal incision, palpable reducible left inguinal hernia, no sign of right inguinal hernia. Lymphatic:  No palpable cervical or axillary lymphadenopathy Skin:  Warm, dry; no sign of jaundice Psychiatric - alert and oriented x 4; calm mood and affect     Assessment and Plan:  Diagnoses and all orders for this visit:   Left inguinal hernia     Recommend left inguinal hernia repair with mesh.  The surgical procedure has been discussed with the patient.   Potential risks, benefits, alternative treatments, and expected outcomes have been explained.  All of the patient's questions at this time have been answered.  The likelihood of reaching the patient's treatment goal is good.  The patient understand the proposed surgical procedure and wishes to proceed.  Donald Arms. Corliss Skains, MD, [facility]  General Surgery   09/15/2022 3:30 AM

## 2022-09-16 ENCOUNTER — Ambulatory Visit (HOSPITAL_BASED_OUTPATIENT_CLINIC_OR_DEPARTMENT_OTHER): Payer: BC Managed Care – PPO | Admitting: Anesthesiology

## 2022-09-16 ENCOUNTER — Ambulatory Visit (HOSPITAL_BASED_OUTPATIENT_CLINIC_OR_DEPARTMENT_OTHER)
Admission: RE | Admit: 2022-09-16 | Discharge: 2022-09-16 | Disposition: A | Discharge status: Home / Self Care | Payer: BC Managed Care – PPO | Attending: Surgery | Admitting: Surgery

## 2022-09-16 ENCOUNTER — Other Ambulatory Visit: Payer: Self-pay

## 2022-09-16 ENCOUNTER — Encounter (HOSPITAL_BASED_OUTPATIENT_CLINIC_OR_DEPARTMENT_OTHER): Payer: Self-pay | Admitting: Surgery

## 2022-09-16 ENCOUNTER — Encounter (HOSPITAL_BASED_OUTPATIENT_CLINIC_OR_DEPARTMENT_OTHER)
Admission: RE | Disposition: A | Discharge status: Home / Self Care | Payer: Self-pay | Source: Home / Self Care | Attending: Surgery

## 2022-09-16 DIAGNOSIS — Z01818 Encounter for other preprocedural examination: Secondary | ICD-10-CM

## 2022-09-16 DIAGNOSIS — Z87891 Personal history of nicotine dependence: Secondary | ICD-10-CM | POA: Insufficient documentation

## 2022-09-16 DIAGNOSIS — K4090 Unilateral inguinal hernia, without obstruction or gangrene, not specified as recurrent: Secondary | ICD-10-CM | POA: Diagnosis present

## 2022-09-16 DIAGNOSIS — F32A Depression, unspecified: Secondary | ICD-10-CM | POA: Insufficient documentation

## 2022-09-16 DIAGNOSIS — Z95810 Presence of automatic (implantable) cardiac defibrillator: Secondary | ICD-10-CM | POA: Insufficient documentation

## 2022-09-16 DIAGNOSIS — Z09 Encounter for follow-up examination after completed treatment for conditions other than malignant neoplasm: Secondary | ICD-10-CM | POA: Diagnosis not present

## 2022-09-16 DIAGNOSIS — J45909 Unspecified asthma, uncomplicated: Secondary | ICD-10-CM | POA: Insufficient documentation

## 2022-09-16 DIAGNOSIS — Z79899 Other long term (current) drug therapy: Secondary | ICD-10-CM | POA: Diagnosis not present

## 2022-09-16 DIAGNOSIS — I10 Essential (primary) hypertension: Secondary | ICD-10-CM | POA: Insufficient documentation

## 2022-09-16 DIAGNOSIS — F419 Anxiety disorder, unspecified: Secondary | ICD-10-CM | POA: Insufficient documentation

## 2022-09-16 DIAGNOSIS — K219 Gastro-esophageal reflux disease without esophagitis: Secondary | ICD-10-CM | POA: Diagnosis not present

## 2022-09-16 HISTORY — PX: INSERTION OF MESH: SHX5868

## 2022-09-16 HISTORY — PX: INGUINAL HERNIA REPAIR: SHX194

## 2022-09-16 SURGERY — REPAIR, HERNIA, INGUINAL, ADULT
Anesthesia: Regional | Site: Groin | Laterality: Left

## 2022-09-16 MED ORDER — MIDAZOLAM HCL 2 MG/2ML IJ SOLN
INTRAMUSCULAR | Status: AC
  Filled 2022-09-16: qty 2

## 2022-09-16 MED ORDER — CHLORHEXIDINE GLUCONATE CLOTH 2 % EX PADS: 6.0000 | MEDICATED_PAD | Freq: Once | CUTANEOUS | Status: DC

## 2022-09-16 MED ORDER — MIDAZOLAM HCL 2 MG/2ML IJ SOLN
2.0000 mg | Freq: Once | INTRAMUSCULAR | Status: AC
  Administered 2022-09-16: 2 mg via INTRAVENOUS

## 2022-09-16 MED ORDER — PROPOFOL 10 MG/ML IV BOLUS
INTRAVENOUS | Status: DC | PRN
  Administered 2022-09-16: 150 mg via INTRAVENOUS
  Administered 2022-09-16: 50 mg via INTRAVENOUS

## 2022-09-16 MED ORDER — LACTATED RINGERS IV SOLN: INTRAVENOUS | Status: DC

## 2022-09-16 MED ORDER — DEXAMETHASONE SODIUM PHOSPHATE 4 MG/ML IJ SOLN
INTRAMUSCULAR | Status: DC | PRN
  Administered 2022-09-16: 5 mg via INTRAVENOUS

## 2022-09-16 MED ORDER — KETOROLAC TROMETHAMINE 30 MG/ML IJ SOLN: 30.0000 mg | Freq: Once | INTRAMUSCULAR | Status: DC | PRN

## 2022-09-16 MED ORDER — BUPIVACAINE-EPINEPHRINE 0.25% -1:200000 IJ SOLN
INTRAMUSCULAR | Status: DC | PRN
  Administered 2022-09-16: 10 mL

## 2022-09-16 MED ORDER — ACETAMINOPHEN 500 MG PO TABS
1000.0000 mg | ORAL_TABLET | ORAL | Status: AC
  Administered 2022-09-16: 1000 mg via ORAL

## 2022-09-16 MED ORDER — CEFAZOLIN SODIUM-DEXTROSE 2-4 GM/100ML-% IV SOLN
2.0000 g | INTRAVENOUS | Status: AC
  Administered 2022-09-16: 2 g via INTRAVENOUS

## 2022-09-16 MED ORDER — AMISULPRIDE (ANTIEMETIC) 5 MG/2ML IV SOLN: 10.0000 mg | Freq: Once | INTRAVENOUS | Status: DC | PRN

## 2022-09-16 MED ORDER — LIDOCAINE 2% (20 MG/ML) 5 ML SYRINGE
INTRAMUSCULAR | Status: DC | PRN
  Administered 2022-09-16: 60 mg via INTRAVENOUS

## 2022-09-16 MED ORDER — ONDANSETRON HCL 4 MG/2ML IJ SOLN
INTRAMUSCULAR | Status: DC | PRN
  Administered 2022-09-16: 4 mg via INTRAVENOUS

## 2022-09-16 MED ORDER — EPHEDRINE SULFATE (PRESSORS) 50 MG/ML IJ SOLN
INTRAMUSCULAR | Status: DC | PRN
  Administered 2022-09-16: 5 mg via INTRAVENOUS

## 2022-09-16 MED ORDER — FENTANYL CITRATE (PF) 100 MCG/2ML IJ SOLN
INTRAMUSCULAR | Status: AC
  Filled 2022-09-16: qty 2

## 2022-09-16 MED ORDER — OXYCODONE HCL 5 MG PO TABS: 5.0000 mg | ORAL_TABLET | Freq: Four times a day (QID) | ORAL | 0 refills | Status: DC | PRN

## 2022-09-16 MED ORDER — ROPIVACAINE HCL 5 MG/ML IJ SOLN
INTRAMUSCULAR | Status: DC | PRN
  Administered 2022-09-16: 30 mL via PERINEURAL

## 2022-09-16 MED ORDER — KETOROLAC TROMETHAMINE 30 MG/ML IJ SOLN
INTRAMUSCULAR | Status: AC
  Filled 2022-09-16: qty 1

## 2022-09-16 MED ORDER — ACETAMINOPHEN 500 MG PO TABS
ORAL_TABLET | ORAL | Status: AC
  Filled 2022-09-16: qty 2

## 2022-09-16 MED ORDER — OXYCODONE HCL 5 MG/5ML PO SOLN: 5.0000 mg | Freq: Once | ORAL | Status: DC | PRN

## 2022-09-16 MED ORDER — OXYCODONE HCL 5 MG PO TABS: 5.0000 mg | ORAL_TABLET | Freq: Once | ORAL | Status: DC | PRN

## 2022-09-16 MED ORDER — FENTANYL CITRATE (PF) 100 MCG/2ML IJ SOLN
25.0000 ug | INTRAMUSCULAR | Status: DC | PRN
  Administered 2022-09-16: 25 ug via INTRAVENOUS
  Administered 2022-09-16: 50 ug via INTRAVENOUS

## 2022-09-16 MED ORDER — CEFAZOLIN SODIUM-DEXTROSE 2-4 GM/100ML-% IV SOLN
INTRAVENOUS | Status: AC
  Filled 2022-09-16: qty 100

## 2022-09-16 MED ORDER — KETOROLAC TROMETHAMINE 30 MG/ML IJ SOLN
INTRAMUSCULAR | Status: DC | PRN
  Administered 2022-09-16: 30 mg via INTRAVENOUS

## 2022-09-16 MED ORDER — PROMETHAZINE HCL 25 MG/ML IJ SOLN: 6.2500 mg | INTRAMUSCULAR | Status: DC | PRN

## 2022-09-16 MED ORDER — FENTANYL CITRATE (PF) 100 MCG/2ML IJ SOLN
INTRAMUSCULAR | Status: DC | PRN
  Administered 2022-09-16 (×2): 50 ug via INTRAVENOUS

## 2022-09-16 SURGICAL SUPPLY — 49 items
APL PRP STRL LF DISP 70% ISPRP (MISCELLANEOUS) ×1
APL SKNCLS STERI-STRIP NONHPOA (GAUZE/BANDAGES/DRESSINGS) ×1
BENZOIN TINCTURE PRP APPL 2/3 (GAUZE/BANDAGES/DRESSINGS) ×1 IMPLANT
BLADE CLIPPER SURG (BLADE) IMPLANT
BLADE HEX COATED 2.75 (ELECTRODE) ×1 IMPLANT
BLADE SURG 15 STRL LF DISP TIS (BLADE) ×1 IMPLANT
BLADE SURG 15 STRL SS (BLADE) ×1
CHLORAPREP W/TINT 26 (MISCELLANEOUS) ×1 IMPLANT
COVER BACK TABLE 60X90IN (DRAPES) ×1 IMPLANT
COVER MAYO STAND STRL (DRAPES) ×1 IMPLANT
DRAIN PENROSE .5X12 LATEX STL (DRAIN) ×1 IMPLANT
DRAPE LAPAROTOMY TRNSV 102X78 (DRAPES) ×1 IMPLANT
DRAPE UTILITY XL STRL (DRAPES) ×1 IMPLANT
DRSG TEGADERM 4X4.75 (GAUZE/BANDAGES/DRESSINGS) ×1 IMPLANT
ELECT REM PT RETURN 9FT ADLT (ELECTROSURGICAL) ×1
ELECTRODE REM PT RTRN 9FT ADLT (ELECTROSURGICAL) ×1 IMPLANT
GAUZE 4X4 16PLY ~~LOC~~+RFID DBL (SPONGE) ×1 IMPLANT
GAUZE SPONGE 4X4 12PLY STRL (GAUZE/BANDAGES/DRESSINGS) ×1 IMPLANT
GAUZE SPONGE 4X4 12PLY STRL LF (GAUZE/BANDAGES/DRESSINGS) ×1 IMPLANT
GLOVE BIO SURGEON STRL SZ7 (GLOVE) ×1 IMPLANT
GLOVE BIOGEL PI IND STRL 7.5 (GLOVE) ×1 IMPLANT
GLOVE SURG SYN 7.5 E (GLOVE) ×1 IMPLANT
GLOVE SURG SYN 7.5 PF PI (GLOVE) IMPLANT
GOWN STRL REUS W/ TWL LRG LVL3 (GOWN DISPOSABLE) ×2 IMPLANT
GOWN STRL REUS W/TWL LRG LVL3 (GOWN DISPOSABLE) ×2
MESH PARIETEX PROGRIP LEFT (Mesh General) IMPLANT
NDL HYPO 25X1 1.5 SAFETY (NEEDLE) ×1 IMPLANT
NEEDLE HYPO 25X1 1.5 SAFETY (NEEDLE) ×1 IMPLANT
NS IRRIG 1000ML POUR BTL (IV SOLUTION) IMPLANT
PACK BASIN DAY SURGERY FS (CUSTOM PROCEDURE TRAY) ×1 IMPLANT
PENCIL SMOKE EVACUATOR (MISCELLANEOUS) ×1 IMPLANT
SLEEVE SCD COMPRESS KNEE MED (STOCKING) ×1 IMPLANT
SPONGE INTESTINAL PEANUT (DISPOSABLE) ×1 IMPLANT
STRIP CLOSURE SKIN 1/2X4 (GAUZE/BANDAGES/DRESSINGS) ×1 IMPLANT
SUT MNCRL AB 4-0 PS2 18 (SUTURE) ×1 IMPLANT
SUT SILK 2 0 SH (SUTURE) IMPLANT
SUT SILK 3 0 TIES 17X18 (SUTURE)
SUT SILK 3-0 18XBRD TIE BLK (SUTURE) IMPLANT
SUT VIC AB 0 CT1 27 (SUTURE)
SUT VIC AB 0 CT1 27XBRD ANBCTR (SUTURE) IMPLANT
SUT VIC AB 2-0 SH 27 (SUTURE) ×1
SUT VIC AB 2-0 SH 27XBRD (SUTURE) ×1 IMPLANT
SUT VIC AB 3-0 SH 27 (SUTURE) ×1
SUT VIC AB 3-0 SH 27X BRD (SUTURE) ×1 IMPLANT
SUT VICRYL 0 CT-2 (SUTURE) ×1 IMPLANT
SYR CONTROL 10ML LL (SYRINGE) ×1 IMPLANT
TOWEL GREEN STERILE FF (TOWEL DISPOSABLE) ×1 IMPLANT
TUBE CONNECTING 20X1/4 (TUBING) IMPLANT
YANKAUER SUCT BULB TIP NO VENT (SUCTIONS) IMPLANT

## 2022-09-16 NOTE — Anesthesia Procedure Notes (Signed)
Anesthesia Regional Block: TAP block   Pre-Anesthetic Checklist: , timeout performed,  Correct Patient, Correct Site, Correct Laterality,  Correct Procedure, Correct Position, site marked,  Risks and benefits discussed,  Surgical consent,  Pre-op evaluation,  At surgeon's request and post-op pain management  Laterality: Left  Prep: chloraprep       Needles:  Injection technique: Single-shot  Needle Type: Echogenic Stimulator Needle     Needle Length: 10cm  Needle Gauge: 20     Additional Needles:   Procedures:,,,, ultrasound used (permanent image in chart),,    Narrative:  Start time: 09/16/2022 8:10 AM End time: 09/16/2022 8:20 AM Injection made incrementally with aspirations every 5 mL.  Performed by: Personally  Anesthesiologist: Leonides Grills, MD  Additional Notes: Functioning IV was confirmed and monitors were applied.  A timeout was performed. Sterile prep, hand hygiene and sterile gloves were used. A 20ga Bbraun echogenic stimulator needle was used. Negative aspiration and negative test dose prior to incremental administration of local anesthetic. The patient tolerated the procedure well.  Ultrasound guidance: relevent anatomy identified, needle position confirmed, local anesthetic spread visualized around nerve(s), vascular puncture avoided.  Image printed for medical record.

## 2022-09-16 NOTE — Anesthesia Procedure Notes (Signed)
Procedure Name: LMA Insertion Date/Time: 09/16/2022 8:49 AM  Performed by: Burna Cash, CRNAPre-anesthesia Checklist: Patient identified, Emergency Drugs available, Suction available and Patient being monitored Patient Re-evaluated:Patient Re-evaluated prior to induction Oxygen Delivery Method: Circle system utilized Preoxygenation: Pre-oxygenation with 100% oxygen Induction Type: IV induction Ventilation: Mask ventilation without difficulty LMA: LMA inserted LMA Size: 5.0 Number of attempts: 1 Airway Equipment and Method: Bite block Placement Confirmation: positive ETCO2 Tube secured with: Tape Dental Injury: Teeth and Oropharynx as per pre-operative assessment

## 2022-09-16 NOTE — Progress Notes (Signed)
Assisted Dr. Ellender with left, transabdominal plane, ultrasound guided block. Side rails up, monitors on throughout procedure. See vital signs in flow sheet. Tolerated Procedure well. ? ?

## 2022-09-16 NOTE — Anesthesia Postprocedure Evaluation (Signed)
Anesthesia Post Note  Patient: Donald Clark  Procedure(s) Performed: OPEN LEFT INGUINAL HERNIA REPAIR WITH MESH (Left: Groin) INSERTION OF MESH (Left: Groin)     Patient location during evaluation: PACU Anesthesia Type: Regional and General Level of consciousness: awake Pain management: pain level controlled Vital Signs Assessment: post-procedure vital signs reviewed and stable Respiratory status: spontaneous breathing, nonlabored ventilation and respiratory function stable Cardiovascular status: blood pressure returned to baseline and stable Postop Assessment: no apparent nausea or vomiting Anesthetic complications: no   No notable events documented.  Last Vitals:  Vitals:   09/16/22 1015 09/16/22 1030  BP: 128/83 132/83  Pulse: 77 74  Resp: 16 16  Temp:  36.5 C  SpO2: 98% 97%    Last Pain:  Vitals:   09/16/22 1030  TempSrc:   PainSc: 3                  Kaushik Maul P Sunshine Mackowski

## 2022-09-16 NOTE — Transfer of Care (Signed)
Immediate Anesthesia Transfer of Care Note  Patient: Donald Clark  Procedure(s) Performed: OPEN LEFT INGUINAL HERNIA REPAIR WITH MESH (Left: Groin) INSERTION OF MESH (Left: Groin)  Patient Location: PACU  Anesthesia Type:General  Level of Consciousness: sedated  Airway & Oxygen Therapy: Patient Spontanous Breathing and Patient connected to face mask oxygen  Post-op Assessment: Report given to RN and Post -op Vital signs reviewed and stable  Post vital signs: Reviewed and stable  Last Vitals:  Vitals Value Taken Time  BP 110/72 09/16/22 0940  Temp 36.4 C 09/16/22 0940  Pulse 69 09/16/22 0944  Resp 13 09/16/22 0944  SpO2 99 % 09/16/22 0944  Vitals shown include unfiled device data.  Last Pain:  Vitals:   09/16/22 0656  TempSrc: Temporal  PainSc: 0-No pain      Patients Stated Pain Goal: 3 (09/16/22 0656)  Complications: No notable events documented.

## 2022-09-16 NOTE — Interval H&P Note (Signed)
History and Physical Interval Note:  09/16/2022 7:19 AM  Donald Clark  has presented today for surgery, with the diagnosis of LEFT INGUINAL HERNIA.  The various methods of treatment have been discussed with the patient and family. After consideration of risks, benefits and other options for treatment, the patient has consented to  Procedure(s): OPEN LEFT INGUINAL HERNIA REPAIR WITH MESH (Left) as a surgical intervention.  The patient's history has been reviewed, patient examined, no change in status, stable for surgery.  I have reviewed the patient's chart and labs.  Questions were answered to the patient's satisfaction.     Wynona Luna

## 2022-09-16 NOTE — Op Note (Signed)
Left Inguinal hernia repair with mesh Procedure Note  Indications: The patient presented with a history of a left, reducible inguinal hernia.    Pre-operative Diagnosis: left reducible inguinal hernia Post-operative Diagnosis: same  Surgeon: Wynona Luna   Assistants: none  Anesthesia: General LMA anesthesia and TAP block  ASA Class: 2  Procedure Details  The patient was seen again in the Holding Room. The risks, benefits, complications, treatment options, and expected outcomes were discussed with the patient. The possibilities of reaction to medication, pulmonary aspiration, perforation of viscus, bleeding, recurrent infection, the need for additional procedures, and development of a complication requiring transfusion or further operation were discussed with the patient and/or family. The likelihood of success in repairing the hernia and returning the patient to their previous functional status is good.  There was concurrence with the proposed plan, and informed consent was obtained. The site of surgery was properly noted/marked. The patient was taken to the Operating Room, identified as Donald Clark, and the procedure verified as left inguinal hernia repair. A Time Out was held and the above information confirmed.  The patient was placed in the supine position and underwent induction of anesthesia. The lower abdomen and groin was prepped with Chloraprep and draped in the standard fashion, and 0.25% Marcaine with epinephrine was used to anesthetize the skin over the mid-portion of the left inguinal canal. An oblique incision was made. Dissection was carried down through the subcutaneous tissue with cautery to the external oblique fascia.  We opened the external oblique fascia along the direction of its fibers to the external ring.  The spermatic cord was circumferentially dissected bluntly and retracted with a Penrose drain.  The floor of the inguinal canal was inspected and revealed a  direct hernia defect.  We skeletonized the spermatic cord and reduced a small cord lipoma.  The floor of the inguinal canal was closed with 0 Vicryl.  We used a left Progrip mesh which was inserted and deployed across the floor of the inguinal canal. The mesh was tucked underneath the external oblique fascia laterally.  The flap of the mesh was closed around the spermatic cord to recreate the internal inguinal ring.  The mesh was secured to the pubic tubercle with 0 Vicryl.  Additional stay sutures of 0 Vicryl were used to attach the mesh to the shelving edge inferiorly and to close the mesh flap.  The external oblique fascia was reapproximated with 2-0 Vicryl.  3-0 Vicryl was used to close the subcutaneous tissues and 4-0 Monocryl was used to close the skin in subcuticular fashion.  Benzoin and steri-strips were used to seal the incision.  A clean dressing was applied.  The patient was then extubated and brought to the recovery room in stable condition.  All sponge, instrument, and needle counts were correct prior to closure and at the conclusion of the case.   Estimated Blood Loss: Minimal                 Complications: None; patient tolerated the procedure well.         Disposition: PACU - hemodynamically stable.         Condition: stable  Wilmon Arms. Corliss Skains, MD, [facility] Surgery  General Surgery   09/16/2022 9:40 AM

## 2022-09-16 NOTE — Discharge Instructions (Addendum)
[facility]  INGUINAL HERNIA REPAIR: POST OP INSTRUCTIONS  Always review your discharge instruction sheet given to you by the facility where your surgery was performed. IF YOU HAVE DISABILITY OR FAMILY LEAVE FORMS, YOU MUST BRING THEM TO THE OFFICE FOR PROCESSING.   DO NOT GIVE THEM TO YOUR DOCTOR.  1. A  prescription for pain medication may be given to you upon discharge.  Take your pain medication as prescribed, if needed.  If narcotic pain medicine is not needed, then you may take acetaminophen (Tylenol) or ibuprofen (Advil) as needed. 2. Take your usually prescribed medications unless otherwise directed. If you need a refill on your pain medication, please contact your pharmacy.  They will contact our office to request authorization. Prescriptions will not be filled after 5 pm or on week-ends. 3. You should follow a light diet the first 24 hours after arrival home, such as soup and crackers, etc.  Be sure to include lots of fluids daily.  Resume your normal diet the day after surgery. 4.Most patients will experience some swelling and bruising around the umbilicus or in the groin and scrotum.  Ice packs and reclining will help.  Swelling and bruising can take several days to resolve.  6. It is common to experience some constipation if taking pain medication after surgery.  Increasing fluid intake and taking a stool softener (such as Colace) will usually help or prevent this problem from occurring.  A mild laxative (Milk of Magnesia or Miralax) should be taken according to package directions if there are no bowel movements after 48 hours. 7. Unless discharge instructions indicate otherwise, you may remove your bandages 24-48 hours after surgery, and you may shower at that time.  You may have steri-strips (small skin tapes) in place directly over the incision.  These strips should be left on the skin for 7-10 days.  If your surgeon used skin glue on the incision, you may  shower in 24 hours.  The glue will flake off over the next 2-3 weeks.  Any sutures or staples will be removed at the office during your follow-up visit. 8. ACTIVITIES:  You may resume regular (light) daily activities beginning the next day--such as daily self-care, walking, climbing stairs--gradually increasing activities as tolerated.  You may have sexual intercourse when it is comfortable.  Refrain from any heavy lifting or straining until approved by your doctor.  a.You may drive when you are no longer taking prescription pain medication, you can comfortably wear a seatbelt, and you can safely maneuver your car and apply brakes. b.RETURN TO WORK:   _____________________________________________  9.You should see your doctor in the office for a follow-up appointment approximately 2-3 weeks after your surgery.  Make sure that you call for this appointment within a day or two after you arrive home to insure a convenient appointment time. 10.OTHER INSTRUCTIONS: _________________________    _____________________________________  WHEN TO CALL YOUR DOCTOR: Fever over 101.0 Inability to urinate Nausea and/or vomiting Extreme swelling or bruising Continued bleeding from incision. Increased pain, redness, or drainage from the incision  The clinic staff is available to answer your questions during regular business hours.  Please don't hesitate to call and ask to speak to one of the nurses for clinical concerns.  If you have a medical emergency, go to the nearest emergency room or call 911.  A surgeon from [facility] is always on call at the hospital   [address], [address], [place], [place]  [address] ?  [address] [address], [place], [place]   [address] [phone] ? [phone] ? FAX [phone] Web site: [email or web address]  Post Anesthesia Home Care Instructions  Activity: Get plenty of rest for the remainder of the day. A responsible individual must stay with  you for 24 hours following the procedure.  For the next 24 hours, DO NOT: -Drive a car -Advertising copywriter -Drink alcoholic beverages -Take any medication unless instructed by your physician -Make any legal decisions or sign important papers.  Meals: Start with liquid foods such as gelatin or soup. Progress to regular foods as tolerated. Avoid greasy, spicy, heavy foods. If nausea and/or vomiting occur, drink only clear liquids until the nausea and/or vomiting subsides. Call your physician if vomiting continues.  Special Instructions/Symptoms: Your throat may feel dry or sore from the anesthesia or the breathing tube placed in your throat during surgery. If this causes discomfort, gargle with warm salt water. The discomfort should disappear within 24 hours.  If you had a scopolamine patch placed behind your ear for the management of post- operative nausea and/or vomiting:  1. The medication in the patch is effective for 72 hours, after which it should be removed.  Wrap patch in a tissue and discard in the trash. Wash hands thoroughly with soap and water. 2. You may remove the patch earlier than 72 hours if you experience unpleasant side effects which may include dry mouth, dizziness or visual disturbances. 3. Avoid touching the patch. Wash your hands with soap and water after contact with the patch.     May have Tylenol today after 1:00 PM May have Ibuprofen today after 3:30 PM

## 2022-09-16 NOTE — Anesthesia Preprocedure Evaluation (Addendum)
Anesthesia Evaluation  Patient identified by MRN, date of birth, ID band Patient awake    Reviewed: Allergy & Precautions, NPO status , Patient's Chart, lab work & pertinent test results  Airway Mallampati: III  TM Distance: >3 FB Neck ROM: Full    Dental no notable dental hx.    Pulmonary asthma , former smoker   Pulmonary exam normal        Cardiovascular hypertension, Pt. on medications Normal cardiovascular exam+ Cardiac Defibrillator      Neuro/Psych  Headaches PSYCHIATRIC DISORDERS Anxiety Depression       GI/Hepatic Neg liver ROS,GERD  Medicated and Controlled,,  Endo/Other  negative endocrine ROS    Renal/GU negative Renal ROS     Musculoskeletal  (+) Arthritis ,    Abdominal   Peds  Hematology negative hematology ROS (+)   Anesthesia Other Findings LEFT INGUINAL HERNIA  Reproductive/Obstetrics                             Anesthesia Physical Anesthesia Plan  ASA: 2  Anesthesia Plan: General and Regional   Post-op Pain Management: Regional block*   Induction: Intravenous  PONV Risk Score and Plan: 2 and Ondansetron, Dexamethasone, Midazolam and Treatment may vary due to age or medical condition  Airway Management Planned: LMA  Additional Equipment:   Intra-op Plan:   Post-operative Plan: Extubation in OR  Informed Consent: I have reviewed the patients History and Physical, chart, labs and discussed the procedure including the risks, benefits and alternatives for the proposed anesthesia with the patient or authorized representative who has indicated his/her understanding and acceptance.     Dental advisory given  Plan Discussed with: CRNA  Anesthesia Plan Comments:         Anesthesia Quick Evaluation

## 2022-09-17 ENCOUNTER — Encounter (HOSPITAL_BASED_OUTPATIENT_CLINIC_OR_DEPARTMENT_OTHER): Payer: Self-pay | Admitting: Surgery

## 2022-11-26 ENCOUNTER — Other Ambulatory Visit: Payer: Self-pay | Admitting: Family Medicine

## 2022-12-02 ENCOUNTER — Other Ambulatory Visit: Payer: Self-pay | Admitting: Family Medicine

## 2022-12-02 DIAGNOSIS — I10 Essential (primary) hypertension: Secondary | ICD-10-CM

## 2023-02-25 ENCOUNTER — Ambulatory Visit: Payer: Self-pay | Admitting: Family Medicine

## 2023-02-25 NOTE — Telephone Encounter (Signed)
  Chief Complaint: worsening GERD Symptoms: burning tenderness along sternum Frequency: ongoing for 1 month Pertinent Negatives: Patient denies other symptoms Disposition: [] ED /[x] Urgent Care (no appt availability in office) / [] Appointment(In office/virtual)/ []  [facility]/ [] Home Care/ [] Refused Recommended Disposition /[] [facility]/ []  Follow-up with PCP Additional Notes: The patient reported that since the weekend before thanksgiving he has had had heartburn that has gotten progressively worse. OTC Prevacid and Gaviscon have been unhelpful.  The burning and tender sensation goes along his sternum down to his lower left and right rib cage.  It is bothersome no matter what type of food he eats.  The pain is relieved by Tylenol.  He was diagnosed with GERD in the past and took prescription Nexium and Prevacid but he is no longer taking either.  He has an appointment scheduled for 03/01/23 but wanted to know if there is any recommendation before that time.  He requested to be contacted for an earlier appointment if possible.  Referred the patient to urgent care but he declined stating that he would feel more comfortable seeing his own doctor.   Reason for Disposition  [1] Patient says chest pain feels exactly the same as previously diagnosed "heartburn" AND [2] describes burning in chest AND [3] accompanying sour taste in mouth  Answer Assessment - Initial Assessment Questions 1. LOCATION: "Where does it hurt?"       Sternum and lower rib cage  2. RADIATION: "Does the pain go anywhere else?" (e.g., into neck, jaw, arms, back)     no 3. ONSET: "When did the chest pain begin?" (Minutes, hours or days)      The weekend before Thanksgiving  4. PATTERN: "Does the pain come and go, or has it been constant since it started?"  "Does it get worse with exertion?"      Intermittent with eating  6. SEVERITY: "How bad is the pain?"  (e.g., Scale 1-10; mild, moderate, or severe)    -  MILD (1-3): doesn't interfere with normal activities     - MODERATE (4-7): interferes with normal activities or awakens from sleep    - SEVERE (8-10): excruciating pain, unable to do any normal activities       Uncomfortable; prefers to be seen sooner than 03/01/23 9. CAUSE: "What do you think is causing the chest pain?"     GERD 10. OTHER SYMPTOMS: "Do you have any other symptoms?" (e.g., dizziness, nausea, vomiting, sweating, fever, difficulty breathing, cough)       None  Protocols used: Chest Pain-A-AH

## 2023-03-01 ENCOUNTER — Ambulatory Visit: Payer: BC Managed Care – PPO | Admitting: Family Medicine

## 2023-03-01 VITALS — BP 138/72 | HR 77 | Temp 98.1°F | Wt 176.2 lb

## 2023-03-01 DIAGNOSIS — K219 Gastro-esophageal reflux disease without esophagitis: Secondary | ICD-10-CM

## 2023-03-01 MED ORDER — PANTOPRAZOLE SODIUM 40 MG PO TBEC: 40.0000 mg | DELAYED_RELEASE_TABLET | Freq: Every day | ORAL | 1 refills | Status: DC

## 2023-03-01 NOTE — Progress Notes (Signed)
Established Patient Office Visit  Subjective   Patient ID: Donald Clark, male    DOB: Jun 10, 1960  Age: 62 y.o. MRN: [id number]  Chief Complaint  Patient presents with   Gastroesophageal Reflux    Patient complains of GERD, x1 month, Tried Tums, Prevacid and Pepcid    HPI   Donald Clark is seen with about 1 month history of ongoing GERD symptoms.  Had frequent burning sensation epigastric towards substernal region.  Started week before Thanksgiving.  He had alcohol about 3 days in a row but has had none whatsoever since then.  Has also scaled back and essentially limited caffeine.  Has had some occasional infrequent sensation of dysphagia but this is more of a chronic symptom and not progressive.  Denies any recent nausea or vomiting.  No melena.  He has tried Tums, over-the-counter Prevacid, and Pepcid without much success.  Quit smoking back in 1982.  No significant appetite or weight change.  He has hypertension and takes amlodipine 10 mg daily.  Last colonoscopy 6/20.  No history of EGD.  Past Medical History:  Diagnosis Date   Allergy    Anxiety    Asthma     exercise induced mild no inhaler use   Autoimmune disorder (HCC)    never dx occ causes joint and muscle weakness/soreness   Cancer (HCC)    skin- multiple removed-1 basal cell  2016, 1 squamous cell 2002   Chicken pox as child   Depression    GERD (gastroesophageal reflux disease)    Headache    History of kidney stones    History of migraine 2018   none since 2018   Hypertension    Prostatitis    Shingles 2017   torso, no deficit from   Wears glasses    Past Surgical History:  Procedure Laterality Date   COLONOSCOPY  2019   CYSTOSCOPY WITH RETROGRADE PYELOGRAM, URETEROSCOPY AND STENT PLACEMENT Bilateral 07/24/2020   Procedure: CYSTOSCOPY WITH RETROGRADE PYELOGRAM, URETEROSCOPY AND STENT PLACEMENT;  Surgeon: Sebastian Ache, MD;  Location: [facility];  Service: Urology;  Laterality: Bilateral;  105  MINS   EXTRACORPOREAL SHOCK WAVE LITHOTRIPSY Right 03/29/2017   Procedure: RIGHT EXTRACORPOREAL SHOCK WAVE LITHOTRIPSY (ESWL);  Surgeon: Hildred Laser, MD;  Location: [facility];  Service: Urology;  Laterality: Right;   HOLMIUM LASER APPLICATION Bilateral 07/24/2020   Procedure: HOLMIUM LASER APPLICATION;  Surgeon: Sebastian Ache, MD;  Location: [facility];  Service: Urology;  Laterality: Bilateral;   HYDROCELE EXCISION Left 07/24/2020   Procedure: HYDROCELECTOMY ADULT;  Surgeon: Sebastian Ache, MD;  Location: [facility];  Service: Urology;  Laterality: Left;   INGUINAL HERNIA REPAIR Left 09/16/2022   Procedure: OPEN LEFT INGUINAL HERNIA REPAIR WITH MESH;  Surgeon: Manus Rudd, MD;  Location: [facility];  Service: General;  Laterality: Left;   INSERTION OF MESH Left 09/16/2022   Procedure: INSERTION OF MESH;  Surgeon: Manus Rudd, MD;  Location: [facility];  Service: General;  Laterality: Left;   LITHOTRIPSY  last done jan 2019   x 6   skin cancer removed     on ankle, back neck   UPPER GASTROINTESTINAL ENDOSCOPY  not sure when    reports that he quit smoking about 42 years ago. His smoking use included cigarettes. He started smoking about 46 years ago. He has a 2 pack-year smoking history. He has never used smokeless tobacco. He reports current alcohol use of about 4.0 standard  drinks of alcohol per week. He reports current drug use. family history includes Cancer in his mother; Cancer (age of onset: 32) in his brother; Cancer (age of onset: 88) in his father; Colon cancer in his father and mother; Diabetes in his maternal grandfather and paternal grandmother; Hypertension in his mother; Migraines in his daughter; Rectal cancer in his father. Allergies  Allergen Reactions   Corn-Containing Products Other (See Comments)    Joint inflammation, joint pain   Erythromycin Diarrhea and Nausea Only   Penicillins     GI upset Has  patient had a PCN reaction causing immediate rash, facial/tongue/throat swelling, SOB or lightheadedness with hypotension: No Has patient had a PCN reaction causing severe rash involving mucus membranes or skin necrosis: No Has patient had a PCN reaction that required hospitalization no Has patient had a PCN reaction occurring within the last 10 years: Yes If all of the above answers are "NO", then may proceed with Cephalosporin use.    Sulfa Antibiotics     Hives, joint pain, rash    Review of Systems  Constitutional:  Negative for weight loss.  Respiratory:  Negative for cough and shortness of breath.   Gastrointestinal:  Positive for abdominal pain and heartburn. Negative for blood in stool, constipation, diarrhea, melena, nausea and vomiting.      Objective:     BP 138/72 (BP Location: Left Arm, Patient Position: Sitting, Cuff Size: Normal)   Pulse 77   Temp 98.1 F (36.7 C) (Oral)   Wt 176 lb 3.2 oz (79.9 kg)   SpO2 99%   BMI 27.60 kg/m  BP Readings from Last 3 Encounters:  03/01/23 138/72  09/16/22 132/83  06/03/22 132/88   Wt Readings from Last 3 Encounters:  03/01/23 176 lb 3.2 oz (79.9 kg)  09/16/22 173 lb 1 oz (78.5 kg)  06/03/22 175 lb (79.4 kg)      Physical Exam Vitals reviewed.  Constitutional:      General: He is not in acute distress.    Appearance: He is not ill-appearing.  Cardiovascular:     Rate and Rhythm: Normal rate and regular rhythm.  Pulmonary:     Effort: Pulmonary effort is normal.     Breath sounds: Normal breath sounds.  Abdominal:     Palpations: Abdomen is soft.     Comments: Has some mild epigastric tenderness.  No guarding or rebound.  No masses palpated.  Neurological:     Mental Status: He is alert.      No results found for any visits on 03/01/23.  Last CBC Lab Results  Component Value Date   WBC 4.1 04/17/2022   HGB 16.1 04/17/2022   HCT 46.8 04/17/2022   MCV 84.8 04/17/2022   MCH 28.5 01/20/2016   RDW 13.1  04/17/2022   PLT 207.0 04/17/2022   Last metabolic panel Lab Results  Component Value Date   GLUCOSE 86 04/17/2022   NA 141 04/17/2022   K 4.2 04/17/2022   CL 102 04/17/2022   CO2 31 04/17/2022   BUN 16 04/17/2022   CREATININE 1.10 04/17/2022   GFR 72.06 04/17/2022   CALCIUM 9.7 04/17/2022   PROT 6.7 04/17/2022   ALBUMIN 4.6 04/17/2022   BILITOT 0.7 04/17/2022   ALKPHOS 67 04/17/2022   AST 14 04/17/2022   ALT 17 04/17/2022   ANIONGAP 6 01/20/2016   Last lipids Lab Results  Component Value Date   CHOL 178 04/17/2022   HDL 59.00 04/17/2022   LDLCALC 106 (H)  04/17/2022   TRIG 62.0 04/17/2022   CHOLHDL 3 04/17/2022   Last thyroid functions Lab Results  Component Value Date   TSH 2.16 04/07/2021      The 10-year ASCVD risk score (Arnett DK, et al., 2019) is: 11.9%    Assessment & Plan:   #1 GERD symptoms for the past month poorly controlled with over-the-counter Pepcid and Prevacid.  Does not have any red flags such as appetite change, weight loss, or any progressive dysphagia. -Discussed GERD nonpharmacologic management with handout given -Try to avoid eating within 3 hours of bedtime -Continue avoid caffeine and alcohol as well as other foods likely to trigger such as citric acid, high fat foods, or mints -Start Protonix 40 mg once daily.  If not seeing substantial improvement in 3 to 4 weeks be in touch.  Consider GI referral for consideration for possible EGD if not improving with the above  #2 hypertension stable on amlodipine 10 mg daily continue current dosage  Evelena Peat, MD

## 2023-04-05 ENCOUNTER — Ambulatory Visit: Payer: 59 | Admitting: Family Medicine

## 2023-04-28 ENCOUNTER — Other Ambulatory Visit: Payer: Self-pay | Admitting: Family Medicine

## 2023-05-27 ENCOUNTER — Other Ambulatory Visit: Payer: Self-pay | Admitting: Family Medicine

## 2023-06-26 ENCOUNTER — Other Ambulatory Visit: Payer: Self-pay | Admitting: Family Medicine

## 2023-08-03 ENCOUNTER — Ambulatory Visit: Payer: Self-pay | Admitting: Family Medicine

## 2023-08-03 ENCOUNTER — Encounter: Payer: Self-pay | Admitting: Family Medicine

## 2023-08-03 ENCOUNTER — Ambulatory Visit (INDEPENDENT_AMBULATORY_CARE_PROVIDER_SITE_OTHER): Admitting: Family Medicine

## 2023-08-03 VITALS — BP 132/76 | HR 81 | Temp 98.1°F | Ht 66.54 in | Wt 174.2 lb

## 2023-08-03 DIAGNOSIS — Z800 Family history of malignant neoplasm of digestive organs: Secondary | ICD-10-CM

## 2023-08-03 DIAGNOSIS — D229 Melanocytic nevi, unspecified: Secondary | ICD-10-CM

## 2023-08-03 DIAGNOSIS — Z0000 Encounter for general adult medical examination without abnormal findings: Secondary | ICD-10-CM | POA: Diagnosis not present

## 2023-08-03 LAB — HEPATIC FUNCTION PANEL
ALT: 25 U/L (ref 0–53)
AST: 21 U/L (ref 0–37)
Albumin: 4.9 g/dL (ref 3.5–5.2)
Alkaline Phosphatase: 64 U/L (ref 39–117)
Bilirubin, Direct: 0.1 mg/dL (ref 0.0–0.3)
Total Bilirubin: 0.6 mg/dL (ref 0.2–1.2)
Total Protein: 7.2 g/dL (ref 6.0–8.3)

## 2023-08-03 LAB — CBC WITH DIFFERENTIAL/PLATELET
Basophils Absolute: 0.1 10*3/uL (ref 0.0–0.1)
Basophils Relative: 1 % (ref 0.0–3.0)
Eosinophils Absolute: 0.1 10*3/uL (ref 0.0–0.7)
Eosinophils Relative: 2 % (ref 0.0–5.0)
HCT: 48.5 % (ref 39.0–52.0)
Hemoglobin: 16.4 g/dL (ref 13.0–17.0)
Lymphocytes Relative: 21.3 % (ref 12.0–46.0)
Lymphs Abs: 1.2 10*3/uL (ref 0.7–4.0)
MCHC: 33.9 g/dL (ref 30.0–36.0)
MCV: 84.7 fl (ref 78.0–100.0)
Monocytes Absolute: 0.6 10*3/uL (ref 0.1–1.0)
Monocytes Relative: 10.6 % (ref 3.0–12.0)
Neutro Abs: 3.7 10*3/uL (ref 1.4–7.7)
Neutrophils Relative %: 65.1 % (ref 43.0–77.0)
Platelets: 221 10*3/uL (ref 150.0–400.0)
RBC: 5.72 Mil/uL (ref 4.22–5.81)
RDW: 13.2 % (ref 11.5–15.5)
WBC: 5.6 10*3/uL (ref 4.0–10.5)

## 2023-08-03 LAB — LIPID PANEL
Cholesterol: 199 mg/dL (ref 0–200)
HDL: 61.8 mg/dL (ref >39.00)
LDL Cholesterol: 100 mg/dL — ABNORMAL HIGH (ref 0–99)
NonHDL: 137.39
Total CHOL/HDL Ratio: 3
Triglycerides: 185 mg/dL — ABNORMAL HIGH (ref 0.0–149.0)
VLDL: 37 mg/dL (ref 0.0–40.0)

## 2023-08-03 LAB — BASIC METABOLIC PANEL WITH GFR
BUN: 24 mg/dL — ABNORMAL HIGH (ref 6–23)
CO2: 31 meq/L (ref 19–32)
Calcium: 10.1 mg/dL (ref 8.4–10.5)
Chloride: 99 meq/L (ref 96–112)
Creatinine, Ser: 0.99 mg/dL (ref 0.40–1.50)
GFR: 81.03 mL/min (ref >60.00)
Glucose, Bld: 85 mg/dL (ref 70–99)
Potassium: 3.7 meq/L (ref 3.5–5.1)
Sodium: 139 meq/L (ref 135–145)

## 2023-08-03 LAB — PSA: PSA: 1.36 ng/mL (ref 0.10–4.00)

## 2023-08-03 NOTE — Patient Instructions (Signed)
 Consider Shingrix and Prevnar 20 vaccines.

## 2023-08-03 NOTE — Progress Notes (Signed)
 Established Patient Office Visit  Subjective   Patient ID: Donald Clark, male    DOB: November 23, 1960  Age: 63 y.o. MRN: [id number]  Chief Complaint  Patient presents with   Annual Exam    HPI   Donald Clark is seen for physical exam.  Generally doing well.  Still teaching at [employer].  Is contemplating possible retirement after this year.  He had hernia surgery last summer.  He has had some left testicular pain pretty much since then.  No testicular mass.  No obvious scrotal swelling.  He does see urologist regularly and has upcoming appointment and will discuss with them.  He has history of GERD, hypertension, degenerative arthritis, kidney stones, history of depression.  Blood pressure currently controlled with amlodipine .  He takes Protonix  40 mg daily for GERD.  He has had some BPH symptoms in the past currently on Cialis  5 mg 1/2 tablet daily.  Has had some possible progressive BPH symptoms.  Health maintenance reviewed:  Health Maintenance  Topic Date Due   HIV Screening  Never done   Zoster Vaccines- Shingrix (1 of 2) Never done   COVID-19 Vaccine (4 - 2024-25 season) 11/01/2022   Colonoscopy  08/02/2023   INFLUENZA VACCINE  10/01/2023   DTaP/Tdap/Td (3 - Td or Tdap) 04/05/2030   Hepatitis C Screening  Completed   HPV VACCINES  Aged Out   Meningococcal B Vaccine  Aged Out   - No history of Shingrix vaccine but has had shingles clinically. -Due for repeat colonoscopy.  Last was 5 years ago.  Both parents had colon cancer.  Family history-both parents had colon cancer and both passed away from metastatic colon cancer.  They were both in their early 49s when diagnosed.  He had a brother with some type of rare renal cancer.  None of his siblings have had colon cancer.   Social history-married.  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  Considering retirement soon.  Daughter lives locally and works in social work.  His son is in graduate program at [employer] in  [place].  Patient quit smoking 1982.  Drinks a beer several days per week  Past Medical History:  Diagnosis Date   Allergy    Anxiety    Asthma     exercise induced mild no inhaler use   Autoimmune disorder (HCC)    never dx occ causes joint and muscle weakness/soreness   Cancer (HCC)    skin- multiple removed-1 basal cell  2016, 1 squamous cell 2002   Chicken pox as child   Depression    GERD (gastroesophageal reflux disease)    Headache    History of kidney stones    History of migraine 2018   none since 2018   Hypertension    Prostatitis    Shingles 2017   torso, no deficit from   Wears glasses    Past Surgical History:  Procedure Laterality Date   COLONOSCOPY  2019   CYSTOSCOPY WITH RETROGRADE PYELOGRAM, URETEROSCOPY AND STENT PLACEMENT Bilateral 07/24/2020   Procedure: CYSTOSCOPY WITH RETROGRADE PYELOGRAM, URETEROSCOPY AND STENT PLACEMENT;  Surgeon: Osborn Blaze, MD;  Location: [facility];  Service: Urology;  Laterality: Bilateral;  105 MINS   EXTRACORPOREAL SHOCK WAVE LITHOTRIPSY Right 03/29/2017   Procedure: RIGHT EXTRACORPOREAL SHOCK WAVE LITHOTRIPSY (ESWL);  Surgeon: Bart Born, MD;  Location: [facility];  Service: Urology;  Laterality: Right;   HOLMIUM LASER APPLICATION Bilateral 07/24/2020   Procedure: HOLMIUM LASER APPLICATION;  Surgeon:  Osborn Blaze, MD;  Location: [facility];  Service: Urology;  Laterality: Bilateral;   HYDROCELE EXCISION Left 07/24/2020   Procedure: HYDROCELECTOMY ADULT;  Surgeon: Osborn Blaze, MD;  Location: [facility];  Service: Urology;  Laterality: Left;   INGUINAL HERNIA REPAIR Left 09/16/2022   Procedure: OPEN LEFT INGUINAL HERNIA REPAIR WITH MESH;  Surgeon: Dareen Ebbing, MD;  Location: [facility];  Service: General;  Laterality: Left;   INSERTION OF MESH Left 09/16/2022   Procedure: INSERTION OF MESH;  Surgeon: Dareen Ebbing, MD;  Location: [facility];  Service: General;  Laterality: Left;   LITHOTRIPSY  last done jan 2019   x 6   skin cancer removed     on ankle, back neck   UPPER GASTROINTESTINAL ENDOSCOPY  not sure when    reports that he quit smoking about 42 years ago. His smoking use included cigarettes. He started smoking about 46 years ago. He has a 2 pack-year smoking history. He has never used smokeless tobacco. He reports current alcohol use of about 4.0 standard drinks of alcohol per week. He reports current drug use. family history includes Cancer in his mother; Cancer (age of onset: 38) in his brother; Cancer (age of onset: 37) in his father; Colon cancer in his father and mother; Diabetes in his maternal grandfather and paternal grandmother; Hypertension in his mother; Migraines in his daughter; Rectal cancer in his father. Allergies  Allergen Reactions   Corn-Containing Products Other (See Comments)    Joint inflammation, joint pain   Erythromycin Diarrhea and Nausea Only   Penicillins     GI upset Has patient had a PCN reaction causing immediate rash, facial/tongue/throat swelling, SOB or lightheadedness with hypotension: No Has patient had a PCN reaction causing severe rash involving mucus membranes or skin necrosis: No Has patient had a PCN reaction that required hospitalization no Has patient had a PCN reaction occurring within the last 10 years: Yes If all of the above answers are "NO", then may proceed with Cephalosporin use.    Sulfa Antibiotics     Hives, joint pain, rash     Review of Systems  Constitutional:  Negative for chills, fever, malaise/fatigue and weight loss.  HENT:  Negative for hearing loss.   Eyes:  Negative for blurred vision and double vision.  Respiratory:  Negative for cough and shortness of breath.   Cardiovascular:  Negative for chest pain, palpitations and leg swelling.  Gastrointestinal:  Negative for abdominal pain, blood in stool, constipation and diarrhea.  Genitourinary:   Negative for dysuria.  Skin:  Negative for rash.  Neurological:  Negative for dizziness, speech change, seizures, loss of consciousness and headaches.  Psychiatric/Behavioral:  Negative for depression.       Objective:     BP 132/76 (BP Location: Left Arm, Patient Position: Sitting, Cuff Size: Normal)   Pulse 81   Temp 98.1 F (36.7 C) (Oral)   Ht 5' 6.54" (1.69 m)   Wt 174 lb 3.2 oz (79 kg)   SpO2 96%   BMI 27.67 kg/m  BP Readings from Last 3 Encounters:  08/03/23 132/76  03/01/23 138/72  09/16/22 132/83   Wt Readings from Last 3 Encounters:  08/03/23 174 lb 3.2 oz (79 kg)  03/01/23 176 lb 3.2 oz (79.9 kg)  09/16/22 173 lb 1 oz (78.5 kg)      Physical Exam Vitals reviewed.  Constitutional:      General: He is not in acute  distress.    Appearance: He is not ill-appearing.  HENT:     Right Ear: Tympanic membrane normal.     Left Ear: Tympanic membrane normal.     Mouth/Throat:     Mouth: Mucous membranes are moist.     Pharynx: Oropharynx is clear. No posterior oropharyngeal erythema.  Cardiovascular:     Rate and Rhythm: Normal rate and regular rhythm.     Heart sounds: No murmur heard. Pulmonary:     Effort: Pulmonary effort is normal.     Breath sounds: Normal breath sounds. No wheezing or rales.  Abdominal:     Palpations: Abdomen is soft. There is no mass.     Tenderness: There is no abdominal tenderness. There is no guarding.  Genitourinary:    Comments: Descended bilaterally.  Mild left testicular tenderness.  No masses palpated.  No hernia noted. Musculoskeletal:     Cervical back: Neck supple.     Right lower leg: No edema.     Left lower leg: No edema.  Lymphadenopathy:     Cervical: No cervical adenopathy.  Skin:    Comments: Macular fairly dark-colored nevus upper thoracic area.  Well-demarcated borders.  Mostly symmetric.  Approximately 6 mm diameter.  Neurological:     Mental Status: He is alert.      No results found for any visits on  08/03/23.    The 10-year ASCVD risk score (Arnett DK, et al., 2019) is: 11.1%    Assessment & Plan:   Problem List Items Addressed This Visit       Unprioritized   Family history of colon cancer - Primary   Relevant Orders   Ambulatory referral to Gastroenterology   Other Visit Diagnoses       Physical exam       Relevant Orders   Lipid panel   CBC with Differential/Platelet   Basic metabolic panel with GFR   Hepatic function panel   PSA     Atypical nevi       Relevant Orders   Ambulatory referral to Dermatology     Patient seen for follow-up physical exam.  Strong family history of colon cancer both parents.  He is at the 5-year mark from last colonoscopy.  Set up referral back for repeat colonoscopy.  Also discussed dermatology referral.  He states he had previous basal cell and squamous cell carcinomas.  Referral is placed  -We discussed vaccines including Shingrix and Prevnar 20.  He declines today but will consider later. - Obtain follow-up labs as above - Regarding some recent mild progression of BPH symptoms increase Cialis  to 5 mg once daily.  If remains symptomatic consider addition of Flomax   No follow-ups on file.    Glean Lamy, MD

## 2023-08-25 ENCOUNTER — Other Ambulatory Visit: Payer: Self-pay | Admitting: Family Medicine

## 2023-08-25 DIAGNOSIS — I10 Essential (primary) hypertension: Secondary | ICD-10-CM

## 2023-08-31 ENCOUNTER — Other Ambulatory Visit: Payer: Self-pay | Admitting: Family Medicine

## 2023-09-17 ENCOUNTER — Ambulatory Visit

## 2023-09-17 ENCOUNTER — Encounter: Payer: Self-pay | Admitting: Gastroenterology

## 2023-09-17 VITALS — Ht 66.5 in | Wt 175.0 lb

## 2023-09-17 DIAGNOSIS — Z800 Family history of malignant neoplasm of digestive organs: Secondary | ICD-10-CM

## 2023-09-17 DIAGNOSIS — Z1211 Encounter for screening for malignant neoplasm of colon: Secondary | ICD-10-CM

## 2023-09-17 MED ORDER — PEG 3350-KCL-NA BICARB-NACL 420 G PO SOLR
4000.0000 mL | Freq: Once | ORAL | 0 refills | Status: AC
Start: 2023-09-17 — End: 2023-09-17

## 2023-09-17 NOTE — Progress Notes (Signed)
 Pt's name and DOB verified at the beginning of the pre-visit wit 2 identifiers  Pt denies any difficulty with ambulating,sitting, laying down or rolling side to side  Pt has no issues moving head neck  Has had issues with swallowing off and on pt stateshas had issues for 30+ years   No egg or soy allergy known to patient   No issues known to pt with past sedation with any surgeries or procedures  Patient denies ever being intubated  No FH of Malignant Hyperthermia  Pt is not on home 02   Pt is not on blood thinners   Pt denies issues with constipation   Pt has frequent issues with constipation RN instructed pt to use Miralax per bottles instructions a week before prep days. Pt states they will  Pt is not on dialysis  Pt denise any abnormal heart rhythms   Pt denies any upcoming cardiac testing  Patient's chart reviewed by Norleen Schillings CNRA prior to pre-visit and patient appropriate for the [facility].  Pre-visit completed and red dot placed by patient's name on their procedure day (on provider's schedule).    Visit by phone  Pt states weight is 175 lb  IInstructions reviewed. Pt given  both [facility] main # and MD on call # prior to instructions.  Pt states understanding of instructions. Instructed pt to review instructions again prior to procedure and call main # given if has questions.. Pt states they will.   Instructed pt on where to find instructions on My Chart. Instructed not to do Delta 9 day before and day off

## 2023-10-08 ENCOUNTER — Encounter: Admitting: Gastroenterology

## 2023-11-03 ENCOUNTER — Other Ambulatory Visit: Payer: Self-pay | Admitting: Family Medicine

## 2023-11-23 ENCOUNTER — Encounter: Payer: Self-pay | Admitting: Gastroenterology

## 2023-11-23 ENCOUNTER — Ambulatory Visit (AMBULATORY_SURGERY_CENTER): Admitting: Gastroenterology

## 2023-11-23 VITALS — BP 118/73 | HR 58 | Temp 98.1°F | Resp 10 | Ht 66.0 in | Wt 175.0 lb

## 2023-11-23 DIAGNOSIS — D125 Benign neoplasm of sigmoid colon: Secondary | ICD-10-CM

## 2023-11-23 DIAGNOSIS — D214 Benign neoplasm of connective and other soft tissue of abdomen: Secondary | ICD-10-CM

## 2023-11-23 DIAGNOSIS — Z1211 Encounter for screening for malignant neoplasm of colon: Secondary | ICD-10-CM

## 2023-11-23 DIAGNOSIS — Z800 Family history of malignant neoplasm of digestive organs: Secondary | ICD-10-CM | POA: Diagnosis not present

## 2023-11-23 HISTORY — PX: COLONOSCOPY WITH PROPOFOL: SHX5780

## 2023-11-23 MED ORDER — SODIUM CHLORIDE 0.9 % IV SOLN: 500.0000 mL | Freq: Once | INTRAVENOUS | Status: DC

## 2023-11-23 NOTE — Op Note (Signed)
  [facility] Patient Name: Donald Clark Procedure Date: 11/23/2023 8:24 AM MRN: [id number] Endoscopist: Glendia E. Stacia , MD, [id number] Age: 63 Referring MD:  Date of Birth: September 21, 1960 Gender: Male Account #: [id number] Procedure:                Colonoscopy Indications:              Screening in patient at increased risk: Colorectal                            cancer in mother 62 or older and colorectal cancer                            in father 72 or older; no prior history of polyps;                            last colonoscopy June 2020, normal Medicines:                Monitored Anesthesia Care Procedure:                Pre-Anesthesia Assessment:                           - Prior to the procedure, a History and Physical                            was performed, and patient medications and                            allergies were reviewed. The patient's tolerance of                            previous anesthesia was also reviewed. The risks                            and benefits of the procedure and the sedation                            options and risks were discussed with the patient.                            All questions were answered, and informed consent                            was obtained. Prior Anticoagulants: The patient has                            taken no anticoagulant or antiplatelet agents. ASA                            Grade Assessment: II - A patient with mild systemic                            disease. After reviewing the risks and benefits,  the patient was deemed in satisfactory condition to                            undergo the procedure.                           After obtaining informed consent, the colonoscope                            was passed under direct vision. Throughout the                            procedure, the patient's blood pressure, pulse, and                            oxygen saturations  were monitored continuously. The                            Olympus Scope SN: [id number] was introduced through                            the anus and advanced to the the cecum, identified                            by appendiceal orifice and ileocecal valve. The                            colonoscopy was performed without difficulty. The                            patient tolerated the procedure well. The quality                            of the bowel preparation was adequate. The                            ileocecal valve, appendiceal orifice, and rectum                            were photographed. The bowel preparation used was                            GoLYTELY via split dose instruction. Scope In: 8:38:36 AM Scope Out: 8:55:07 AM Scope Withdrawal Time: 0 hours 11 minutes 16 seconds  Total Procedure Duration: 0 hours 16 minutes 31 seconds  Findings:                 The perianal and digital rectal examinations were                            normal. Pertinent negatives include normal                            sphincter tone and no palpable rectal lesions.  A 6 mm polyp was found in the sigmoid colon. The                            polyp was sessile. The polyp was removed with a                            cold snare. Resection and retrieval were complete.                            Estimated blood loss was minimal.                           The exam was otherwise normal throughout the                            examined colon.                           The retroflexed view of the distal rectum and anal                            verge was normal and showed no anal or rectal                            abnormalities. Complications:            No immediate complications. Estimated Blood Loss:     Estimated blood loss was minimal. Impression:               - One 6 mm polyp in the sigmoid colon, removed with                            a cold snare. Resected and  retrieved.                           - The distal rectum and anal verge are normal on                            retroflexion view. Recommendation:           - Patient has a contact number available for                            emergencies. The signs and symptoms of potential                            delayed complications were discussed with the                            patient. Return to normal activities tomorrow.                            Written discharge instructions were provided to the  patient.                           - Resume previous diet.                           - Continue present medications.                           - Await pathology results.                           - Repeat colonoscopy in 5 years for                            surveillance/high risk screening. Mikella Linsley E. Stacia, MD 11/23/2023 9:00:53 AM This report has been signed electronically.

## 2023-11-23 NOTE — Progress Notes (Signed)
 [facility] History and Physical   Primary Care Physician:  Micheal Wolm ORN, MD   Reason for Procedure:   Colon cancer screening/high risk   Plan:    Surveillance colonoscopy     HPI: Donald Clark is a 63 y.o. male undergoing high risk screening colonoscopy.  His mother and father were both diagnosed with colon cancer in their early 57s.   He has no chronic GI symptoms.  His last colonoscopy in June 2020 was normal.  No prior history of polyps.  Past Medical History:  Diagnosis Date   Allergy    Anxiety    Asthma     exercise induced mild no inhaler use   Autoimmune disorder    never dx occ causes joint and muscle weakness/soreness corn allergy   Cancer (HCC)    skin- multiple removed-1 basal cell  2016, 1 squamous cell 2002   Chicken pox as child   Depression    GERD (gastroesophageal reflux disease)    Headache    History of kidney stones    History of migraine 2018   none since 2018   Hypertension    Prostatitis    Shingles 2017   torso, no deficit from   Wears glasses     Past Surgical History:  Procedure Laterality Date   COLONOSCOPY  2019   CYSTOSCOPY WITH RETROGRADE PYELOGRAM, URETEROSCOPY AND STENT PLACEMENT Bilateral 07/24/2020   Procedure: CYSTOSCOPY WITH RETROGRADE PYELOGRAM, URETEROSCOPY AND STENT PLACEMENT;  Surgeon: Alvaro Hummer, MD;  Location: [facility];  Service: Urology;  Laterality: Bilateral;  105 MINS   EXTRACORPOREAL SHOCK WAVE LITHOTRIPSY Right 03/29/2017   Procedure: RIGHT EXTRACORPOREAL SHOCK WAVE LITHOTRIPSY (ESWL);  Surgeon: Chauncey Redell Agent, MD;  Location: [facility];  Service: Urology;  Laterality: Right;   HOLMIUM LASER APPLICATION Bilateral 07/24/2020   Procedure: HOLMIUM LASER APPLICATION;  Surgeon: Alvaro Hummer, MD;  Location: [facility];  Service: Urology;  Laterality: Bilateral;   HYDROCELE EXCISION Left 07/24/2020   Procedure: HYDROCELECTOMY ADULT;  Surgeon: Alvaro Hummer, MD;   Location: [facility];  Service: Urology;  Laterality: Left;   INGUINAL HERNIA REPAIR Left 09/16/2022   Procedure: OPEN LEFT INGUINAL HERNIA REPAIR WITH MESH;  Surgeon: Belinda Cough, MD;  Location: [facility];  Service: General;  Laterality: Left;   INSERTION OF MESH Left 09/16/2022   Procedure: INSERTION OF MESH;  Surgeon: Belinda Cough, MD;  Location: [facility];  Service: General;  Laterality: Left;   LITHOTRIPSY  last done jan 2019   x 6   skin cancer removed     on ankle, back neck   UPPER GASTROINTESTINAL ENDOSCOPY  not sure when    Prior to Admission medications   Medication Sig Start Date End Date Taking? Authorizing Provider  amLODipine  (NORVASC ) 10 MG tablet TAKE 1 TABLET BY MOUTH EVERY DAY 08/25/23  Yes Burchette, Wolm ORN, MD  buPROPion  (WELLBUTRIN  XL) 300 MG 24 hr tablet TAKE 1 TABLET BY MOUTH EVERY DAY 08/25/23  Yes Burchette, Wolm ORN, MD  fluticasone  (FLONASE ) 50 MCG/ACT nasal spray Place 2 sprays into both nostrils daily.   Yes [provider]  indapamide (LOZOL) 2.5 MG tablet Take 2.5 mg by mouth daily.   Yes [provider]  tadalafil  (CIALIS ) 5 MG tablet Take 1 tablet (5 mg total) by mouth daily. 04/17/22  Yes Burchette, Wolm ORN, MD  Multiple Vitamin (MULTIVITAMIN WITH MINERALS) TABS Take 1 tablet by mouth daily. Reported on 06/12/2015  [provider]  pantoprazole  (PROTONIX ) 40 MG tablet TAKE 1 TABLET BY MOUTH EVERY DAY 11/03/23   Burchette, Wolm ORN, MD    Current Outpatient Medications  Medication Sig Dispense Refill   amLODipine  (NORVASC ) 10 MG tablet TAKE 1 TABLET BY MOUTH EVERY DAY 90 tablet 1   buPROPion  (WELLBUTRIN  XL) 300 MG 24 hr tablet TAKE 1 TABLET BY MOUTH EVERY DAY 90 tablet 1   fluticasone  (FLONASE ) 50 MCG/ACT nasal spray Place 2 sprays into both nostrils daily.     indapamide (LOZOL) 2.5 MG tablet Take 2.5 mg by mouth daily.     tadalafil  (CIALIS ) 5 MG tablet Take 1 tablet (5 mg total)  by mouth daily. 90 tablet 3   Multiple Vitamin (MULTIVITAMIN WITH MINERALS) TABS Take 1 tablet by mouth daily. Reported on 06/12/2015     pantoprazole  (PROTONIX ) 40 MG tablet TAKE 1 TABLET BY MOUTH EVERY DAY 30 tablet 1   Current Facility-Administered Medications  Medication Dose Route Frequency Provider Last Rate Last Admin   0.9 %  sodium chloride  infusion  500 mL Intravenous Once Stacia Glendia BRAVO, MD        Allergies as of 11/23/2023 - Review Complete 11/23/2023  Allergen Reaction Noted   Sulfa antibiotics Nausea And Vomiting 10/14/2010   Corn-containing products Other (See Comments) 09/16/2022   Erythromycin Diarrhea and Nausea Only 09/08/2022   Penicillins Other (See Comments) 10/14/2010    Family History  Problem Relation Age of Onset   Cancer Mother        colon, uterine CA   Hypertension Mother    Colon cancer Mother    Cancer Father 87       colon cancer   Rectal cancer Father    Colon cancer Father    Cancer Brother 1       kidney cancer   Diabetes Maternal Grandfather    Diabetes Paternal Grandmother    Migraines Daughter    Esophageal cancer Neg Hx    Stomach cancer Neg Hx    Ulcerative colitis Neg Hx    Colon polyps Neg Hx     Social History   Socioeconomic History   Marital status: Married    Spouse name: Not on file   Number of children: Not on file   Years of education: Not on file   Highest education level: Master's degree (e.g., MA, MS, MEng, MEd, MSW, MBA)  Occupational History   Not on file  Tobacco Use   Smoking status: Former    Current packs/day: 0.00    Average packs/day: 0.5 packs/day for 4.0 years (2.0 ttl pk-yrs)    Types: Cigarettes    Start date: 10/13/1976    Quit date: 10/13/1980    Years since quitting: 43.1   Smokeless tobacco: Never  Vaping Use   Vaping status: Never Used  Substance and Sexual Activity   Alcohol use: Yes    Alcohol/week: 4.0 standard drinks of alcohol    Types: 4 Standard drinks or equivalent per week    Drug use: Yes    Comment: delta 9, CBD   Sexual activity: Yes  Other Topics Concern   Not on file  Social History Narrative   Are you right handed or left handed? Right   Are you currently employed ? 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 NO   Who lives with you? Wife   What type of home do you live in: 1 story  or 2 story? 2       Social Drivers of Corporate investment banker Strain: Patient Declined (03/01/2023)   Overall Financial Resource Strain (CARDIA)    Difficulty of Paying Living Expenses: Patient declined  Food Insecurity: No Food Insecurity (03/01/2023)   Hunger Vital Sign    Worried About Running Out of Food in the Last Year: Never true    Ran Out of Food in the Last Year: Never true  Transportation Needs: No Transportation Needs (03/01/2023)   PRAPARE - Administrator, Civil Service (Medical): No    Lack of Transportation (Non-Medical): No  Physical Activity: Insufficiently Active (03/01/2023)   Exercise Vital Sign    Days of Exercise per Week: 1 day    Minutes of Exercise per Session: 20 min  Stress: No Stress Concern Present (03/01/2023)   [employer] of Occupational Health - Occupational Stress Questionnaire    Feeling of Stress : Only a little  Social Connections: Unknown (03/01/2023)   Social Connection and Isolation Panel    Frequency of Communication with Friends and Family: Once a week    Frequency of Social Gatherings with Friends and Family: Patient declined    Attends Religious Services: Patient declined    Database administrator or Organizations: No    Attends Engineer, structural: Not on file    Marital Status: Married  Catering manager Violence: Not on file    Review of Systems:  All other review of systems negative except as mentioned in the HPI.  Physical Exam: Vital signs BP 129/88   Pulse 84   Temp 98.1 F (36.7 C)   Ht 5' 6 (1.676 m)   Wt 175 lb (79.4 kg)   SpO2 97%    BMI 28.25 kg/m   General:   Alert,  Well-developed, well-nourished, pleasant and cooperative in NAD Airway:  Mallampati 2 Lungs:  Clear throughout to auscultation.   Heart:  Regular rate and rhythm; no murmurs, clicks, rubs,  or gallops. Abdomen:  Soft, nontender and nondistended. Normal bowel sounds.   Neuro/Psych:  Normal mood and affect. A and O x 3   Briani Maul E. Stacia, MD [facility]

## 2023-11-23 NOTE — Progress Notes (Signed)
 Called to room to assist during endoscopic procedure.  Patient ID and intended procedure confirmed with present staff. Received instructions for my participation in the procedure from the performing physician.

## 2023-11-23 NOTE — Progress Notes (Signed)
 A/o x 3, VSS, good SR's, pleased with anesthesia, report to RN

## 2023-11-23 NOTE — Patient Instructions (Addendum)
-  Handout on polyps provided -Await pathology results  YOU HAD AN ENDOSCOPIC PROCEDURE TODAY AT THE [facility]:   Refer to the procedure report that was given to you for any specific questions about what was found during the examination.  If the procedure report does not answer your questions, please call your gastroenterologist to clarify.  If you requested that your care partner not be given the details of your procedure findings, then the procedure report has been included in a sealed envelope for you to review at your convenience later.  YOU SHOULD EXPECT: Some feelings of bloating in the abdomen. Passage of more gas than usual.  Walking can help get rid of the air that was put into your GI tract during the procedure and reduce the bloating. If you had a lower endoscopy (such as a colonoscopy or flexible sigmoidoscopy) you may notice spotting of blood in your stool or on the toilet paper. If you underwent a bowel prep for your procedure, you may not have a normal bowel movement for a few days.  Please Note:  You might notice some irritation and congestion in your nose or some drainage.  This is from the oxygen used during your procedure.  There is no need for concern and it should clear up in a day or so.  SYMPTOMS TO REPORT IMMEDIATELY:  Following lower endoscopy (colonoscopy or flexible sigmoidoscopy):  Excessive amounts of blood in the stool  Significant tenderness or worsening of abdominal pains  Swelling of the abdomen that is new, acute  Fever of 100F or higher  For urgent or emergent issues, a gastroenterologist can be reached at any hour by calling [phone]. Do not use MyChart messaging for urgent concerns.    DIET:  We do recommend a small meal at first, but then you may proceed to your regular diet.  Drink plenty of fluids but you should avoid alcoholic beverages for 24 hours.  ACTIVITY:  You should plan to take it easy for the rest of today and you should NOT  DRIVE or use heavy machinery until tomorrow (because of the sedation medicines used during the test).    FOLLOW UP: Our staff will call the number listed on your records the next business day following your procedure.  We will call around 7:15- 8:00 am to check on you and address any questions or concerns that you may have regarding the information given to you following your procedure. If we do not reach you, we will leave a message.     If any biopsies were taken you will be contacted by phone or by letter within the next 1-3 weeks.  Please call us  at [phone] if you have not heard about the biopsies in 3 weeks.    SIGNATURES/CONFIDENTIALITY: You and/or your care partner have signed paperwork which will be entered into your electronic medical record.  These signatures attest to the fact that that the information above on your After Visit Summary has been reviewed and is understood.  Full responsibility of the confidentiality of this discharge information lies with you and/or your care-partner.

## 2023-11-24 ENCOUNTER — Telehealth: Payer: Self-pay

## 2023-11-24 NOTE — Telephone Encounter (Signed)
 Unable to make follow up call. Unable to leave voice message.

## 2023-11-25 LAB — SURGICAL PATHOLOGY

## 2023-11-28 ENCOUNTER — Ambulatory Visit: Payer: Self-pay | Admitting: Gastroenterology

## 2023-11-28 NOTE — Progress Notes (Signed)
 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  These are not considered precancerous.   Because of your strong family history of colon cancer, I recommend you continue close monitoring and repeat colonoscopy in 5 years.

## 2023-12-28 ENCOUNTER — Encounter: Payer: Self-pay | Admitting: Family Medicine

## 2023-12-28 ENCOUNTER — Ambulatory Visit: Admitting: Dermatology

## 2023-12-28 ENCOUNTER — Ambulatory Visit: Admitting: Family Medicine

## 2023-12-28 ENCOUNTER — Encounter: Payer: Self-pay | Admitting: Dermatology

## 2023-12-28 VITALS — BP 140/83 | HR 90

## 2023-12-28 VITALS — BP 134/80 | HR 80 | Temp 98.2°F | Wt 169.4 lb

## 2023-12-28 DIAGNOSIS — D485 Neoplasm of uncertain behavior of skin: Secondary | ICD-10-CM | POA: Diagnosis not present

## 2023-12-28 DIAGNOSIS — C44519 Basal cell carcinoma of skin of other part of trunk: Secondary | ICD-10-CM | POA: Diagnosis not present

## 2023-12-28 DIAGNOSIS — D2371 Other benign neoplasm of skin of right lower limb, including hip: Secondary | ICD-10-CM

## 2023-12-28 DIAGNOSIS — L570 Actinic keratosis: Secondary | ICD-10-CM | POA: Diagnosis not present

## 2023-12-28 DIAGNOSIS — W908XXA Exposure to other nonionizing radiation, initial encounter: Secondary | ICD-10-CM

## 2023-12-28 DIAGNOSIS — L578 Other skin changes due to chronic exposure to nonionizing radiation: Secondary | ICD-10-CM

## 2023-12-28 DIAGNOSIS — Z85828 Personal history of other malignant neoplasm of skin: Secondary | ICD-10-CM

## 2023-12-28 DIAGNOSIS — D229 Melanocytic nevi, unspecified: Secondary | ICD-10-CM

## 2023-12-28 DIAGNOSIS — L821 Other seborrheic keratosis: Secondary | ICD-10-CM

## 2023-12-28 DIAGNOSIS — Z1283 Encounter for screening for malignant neoplasm of skin: Secondary | ICD-10-CM

## 2023-12-28 DIAGNOSIS — L814 Other melanin hyperpigmentation: Secondary | ICD-10-CM | POA: Diagnosis not present

## 2023-12-28 DIAGNOSIS — H9192 Unspecified hearing loss, left ear: Secondary | ICD-10-CM | POA: Diagnosis not present

## 2023-12-28 DIAGNOSIS — D1801 Hemangioma of skin and subcutaneous tissue: Secondary | ICD-10-CM

## 2023-12-28 DIAGNOSIS — Z8589 Personal history of malignant neoplasm of other organs and systems: Secondary | ICD-10-CM

## 2023-12-28 DIAGNOSIS — C4491 Basal cell carcinoma of skin, unspecified: Secondary | ICD-10-CM

## 2023-12-28 HISTORY — DX: Basal cell carcinoma of skin, unspecified: C44.91

## 2023-12-28 NOTE — Progress Notes (Signed)
 Established Patient Office Visit  Subjective   Patient ID: Donald Clark, male    DOB: 19-Dec-1960  Age: 63 y.o. MRN: [id number]  Chief Complaint  Patient presents with   Tinnitus    HPI   Donald Clark is seen with what he describes as low-frequency noise left ear for the past month and a half or so.  Occasional mild pain.  Has some subjective hearing loss left ear compared with right.  He states when he moves his head side-to-side rapidly he has very transient reduction in low-frequency noise but otherwise this is constant.  No persistent vertigo symptoms.  No nausea or vomiting.  Has had some poorly localized frontal and parietal headaches but no progressive daily headache.  Past Medical History:  Diagnosis Date   Allergy    Anxiety    Asthma     exercise induced mild no inhaler use   Autoimmune disorder    never dx occ causes joint and muscle weakness/soreness corn allergy   Cancer (HCC)    skin- multiple removed-1 basal cell  2016, 1 squamous cell 2002   Chicken pox as child   Depression    GERD (gastroesophageal reflux disease)    Headache    History of kidney stones    History of migraine 2018   none since 2018   Hypertension    Prostatitis    Shingles 2017   torso, no deficit from   Wears glasses    Past Surgical History:  Procedure Laterality Date   COLONOSCOPY  2019   COLONOSCOPY WITH PROPOFOL   11/23/2023   CYSTOSCOPY WITH RETROGRADE PYELOGRAM, URETEROSCOPY AND STENT PLACEMENT Bilateral 07/24/2020   Procedure: CYSTOSCOPY WITH RETROGRADE PYELOGRAM, URETEROSCOPY AND STENT PLACEMENT;  Surgeon: Alvaro Hummer, MD;  Location: [facility];  Service: Urology;  Laterality: Bilateral;  105 MINS   EXTRACORPOREAL SHOCK WAVE LITHOTRIPSY Right 03/29/2017   Procedure: RIGHT EXTRACORPOREAL SHOCK WAVE LITHOTRIPSY (ESWL);  Surgeon: Chauncey Redell Agent, MD;  Location: [facility];  Service: Urology;  Laterality: Right;   HOLMIUM LASER APPLICATION Bilateral 07/24/2020    Procedure: HOLMIUM LASER APPLICATION;  Surgeon: Alvaro Hummer, MD;  Location[facility];  Service: Urology;  Laterality: Bilateral;   HYDROCELE EXCISION Left 07/24/2020   Procedure: HYDROCELECTOMY ADULT;  Surgeon: Alvaro Hummer, MD;  Location[facility];  Service: Urology;  Laterality: Left;   INGUINAL HERNIA REPAIR Left 09/16/2022   Procedure: OPEN LEFT INGUINAL HERNIA REPAIR WITH MESH;  Surgeon: Belinda Cough, MD;  Location: [facility];  Service: General;  Laterality: Left;   INSERTION OF MESH Left 09/16/2022   Procedure: INSERTION OF MESH;  Surgeon: Belinda Cough, MD;  Location: [facility];  Service: General;  Laterality: Left;   LITHOTRIPSY  last done jan 2019   x 6   skin cancer removed     on ankle, back neck   UPPER GASTROINTESTINAL ENDOSCOPY  not sure when    reports that he quit smoking about 43 years ago. His smoking use included cigarettes. He started smoking about 47 years ago. He has a 2 pack-year smoking history. He has never used smokeless tobacco. He reports current alcohol use of about 4.0 standard drinks of alcohol per week. He reports current drug use. family history includes Cancer in his mother; Cancer (age of onset: 77) in his brother; Cancer (age of onset: 61) in his father; Colon cancer in his father and mother; Diabetes in his maternal grandfather and paternal grandmother; Hypertension  in his mother; Migraines in his daughter; Rectal cancer in his father. Allergies  Allergen Reactions   Sulfa Antibiotics Nausea And Vomiting    Hives, joint pain, rash   Corn-Containing Products Other (See Comments)    Joint inflammation, joint pain   Erythromycin Diarrhea and Nausea Only   Penicillins Other (See Comments)    GI upset Has patient had a PCN reaction causing immediate rash, facial/tongue/throat swelling, SOB or lightheadedness with hypotension: No Has patient had a PCN reaction causing severe rash  involving mucus membranes or skin necrosis: No Has patient had a PCN reaction that required hospitalization no Has patient had a PCN reaction occurring within the last 10 years: Yes If all of the above answers are NO, then may proceed with Cephalosporin use.     Review of Systems  Constitutional:  Negative for chills and fever.  HENT:  Positive for hearing loss and tinnitus. Negative for ear discharge and sinus pain.   Neurological:  Positive for headaches. Negative for speech change, focal weakness and loss of consciousness.      Objective:     BP 134/80   Pulse 80   Temp 98.2 F (36.8 C) (Oral)   Wt 169 lb 6.4 oz (76.8 kg)   SpO2 98%   BMI 27.34 kg/m  BP Readings from Last 3 Encounters:  12/28/23 134/80  11/23/23 118/73  08/03/23 132/76   Wt Readings from Last 3 Encounters:  12/28/23 169 lb 6.4 oz (76.8 kg)  11/23/23 175 lb (79.4 kg)  09/17/23 175 lb (79.4 kg)      Physical Exam Vitals reviewed.  Constitutional:      General: He is not in acute distress.    Appearance: He is not ill-appearing or toxic-appearing.  HENT:     Head: Normocephalic and atraumatic.     Right Ear: Tympanic membrane and external ear normal.     Left Ear: Tympanic membrane and external ear normal.     Ears:     Comments: No significant cerumen in ear canals Cardiovascular:     Rate and Rhythm: Normal rate and regular rhythm.  Neurological:     General: No focal deficit present.     Mental Status: He is alert.     Cranial Nerves: No cranial nerve deficit.      No results found for any visits on 12/28/23.    The 10-year ASCVD risk score (Arnett DK, et al., 2019) is: 12%    Assessment & Plan:   Problem List Items Addressed This Visit   None Visit Diagnoses       Unilateral hearing loss, left    -  Primary   Relevant Orders   Ambulatory referral to Audiology     1-1/26-month history of subjective decline in hearing left ear with possible low-grade tinnitus.  Set up  audiology assessment as initial step.  If symptoms persist and audiology assessment unremarkable consider ENT referral  No follow-ups on file.    Donald Scarlet, MD

## 2023-12-28 NOTE — Patient Instructions (Addendum)

## 2023-12-28 NOTE — Progress Notes (Signed)
 New Patient Visit   Subjective  Donald Clark is a 63 y.o. male who presents for the following:  Total Body Skin Exam (TBSE)  Patient present today for new patient visit for TBSE.The patient reports he  has spots, moles and lesions to be evaluated, some may be new or changing. Patient has not previously been treated by a dermatologist. Patient reports he  does not have hx of bx. Patient admits to family/self history of skin cancers. Patient reports throughout his lifetime has had moderate sun exposure. Currently, patient reports if he  has excessive sun exposure, he  does apply sunscreen and/or wears protective coverings.  The following portions of the chart were reviewed this encounter and updated as appropriate: medications, allergies, medical history  Review of Systems:  No other skin or systemic complaints except as noted in HPI or Assessment and Plan.  Objective  Well appearing patient in no apparent distress; mood and affect are within normal limits.  A full examination was performed including scalp, head, eyes, ears, nose, lips, neck, chest, axillae, abdomen, back, buttocks, bilateral upper extremities, bilateral lower extremities, hands, feet, fingers, toes, fingernails, and toenails. All findings within normal limits unless otherwise noted below.   Relevant exam findings are noted in the Assessment and Plan.  Left Upper Back 0.8 Pink pauple  Right Thigh - Anterior 0.7 pink scaly papule with focal central nodule   Assessment & Plan   LENTIGINES, SEBORRHEIC KERATOSES, HEMANGIOMAS - Benign normal skin lesions - Benign-appearing - Call for any changes  MELANOCYTIC NEVI - Tan-brown and/or pink-flesh-colored symmetric macules and papules - Benign appearing on exam today - Observation - Call clinic for new or changing moles - Recommend daily use of broad spectrum spf 30+ sunscreen to sun-exposed areas.   ACTINIC DAMAGE - Chronic condition, secondary to cumulative  UV/sun exposure - diffuse scaly erythematous macules with underlying dyspigmentation - Recommend daily broad spectrum sunscreen SPF 30+ to sun-exposed areas, reapply every 2 hours as needed.  - Staying in the shade or wearing long sleeves, sun glasses (UVA+UVB protection) and wide brim hats (4-inch brim around the entire circumference of the hat) are also recommended for sun protection.  - Call for new or changing lesions.  SKIN CANCER SCREENING PERFORMED TODAY  HISTORY OF BASAL CELL CARCINOMA OF THE SKIN - No evidence of recurrence today - Recommend regular full body skin exams - Recommend daily broad spectrum sunscreen SPF 30+ to sun-exposed areas, reapply every 2 hours as needed.  - Call if any new or changing lesions are noted between office visits   HISTORY OF SQUAMOUS CELL CARCINOMA OF THE SKIN - No evidence of recurrence today - No lymphadenopathy - Recommend regular full body skin exams - Recommend daily broad spectrum sunscreen SPF 30+ to sun-exposed areas, reapply every 2 hours as needed.  - Call if any new or changing lesions are noted between office visits  NEOPLASM OF UNCERTAIN BEHAVIOR OF SKIN (2) Left Upper Back Skin / nail biopsy Type of biopsy: tangential   Informed consent: discussed and consent obtained   Timeout: patient name, date of birth, surgical site, and procedure verified   Procedure prep:  Patient was prepped and draped in usual sterile fashion Prep type:  Isopropyl alcohol Anesthesia: the lesion was anesthetized in a standard fashion   Anesthetic:  1% lidocaine  w/ epinephrine  1-100,000 buffered w/ 8.4% NaHCO3 Instrument used: DermaBlade   Hemostasis achieved with: aluminum chloride   Outcome: patient tolerated procedure well   Post-procedure details:  sterile dressing applied and wound care instructions given   Dressing type: bandage and pressure dressing    Specimen A - Surgical pathology Differential Diagnosis: NNMSC  Check Margins: No Right  Thigh - Anterior Skin / nail biopsy Type of biopsy: tangential   Informed consent: discussed and consent obtained   Timeout: patient name, date of birth, surgical site, and procedure verified   Procedure prep:  Patient was prepped and draped in usual sterile fashion Prep type:  Isopropyl alcohol Anesthesia: the lesion was anesthetized in a standard fashion   Anesthetic:  1% lidocaine  w/ epinephrine  1-100,000 buffered w/ 8.4% NaHCO3 Instrument used: DermaBlade   Hemostasis achieved with: aluminum chloride   Outcome: patient tolerated procedure well   Post-procedure details: sterile dressing applied and wound care instructions given   Dressing type: bandage and pressure dressing    Specimen B - Surgical pathology Differential Diagnosis: NNMSC  Check Margins: No AK (ACTINIC KERATOSIS) (3) Left Lower Back, Right Dorsal Hand, Right Nasal Sidewall Destruction of lesion - Left Lower Back, Right Dorsal Hand, Right Nasal Sidewall Complexity: simple   Destruction method: cryotherapy   Timeout:  patient name, date of birth, surgical site, and procedure verified Lesion destroyed using liquid nitrogen: Yes   Post-procedure details: wound care instructions given     Return in about 9 months (around 09/26/2024) for TBSE follow up.  I, Doyce Pan, CMA, am acting as scribe for RUFUS CHRISTELLA HOLY, MD.   Documentation: I have reviewed the above documentation for accuracy and completeness, and I agree with the above.  RUFUS CHRISTELLA HOLY, MD

## 2023-12-30 LAB — SURGICAL PATHOLOGY

## 2023-12-31 ENCOUNTER — Ambulatory Visit: Payer: Self-pay | Admitting: Dermatology

## 2023-12-31 NOTE — Progress Notes (Signed)
 Spoke with pt gave him bx results and treatment recommendation

## 2023-12-31 NOTE — Telephone Encounter (Signed)
 Lm for pt to call back for results

## 2023-12-31 NOTE — Telephone Encounter (Signed)
-----   Message from [facility] PACI sent at 12/31/2023  8:31 AM EDT ----- BCC- left upper back- WLE Clear cell acanthoma- right thigh- benign   ----- Message ----- From: Interface, Lab In Three Zero One Sent: 12/30/2023   5:33 PM EDT To: Rufus CHRISTELLA Holy, MD

## 2024-01-11 ENCOUNTER — Other Ambulatory Visit: Payer: Self-pay | Admitting: Family Medicine

## 2024-02-01 ENCOUNTER — Encounter: Payer: Self-pay | Admitting: Family Medicine

## 2024-02-01 ENCOUNTER — Ambulatory Visit: Attending: Family Medicine | Admitting: Audiologist

## 2024-02-01 DIAGNOSIS — H903 Sensorineural hearing loss, bilateral: Secondary | ICD-10-CM | POA: Insufficient documentation

## 2024-02-01 NOTE — Procedures (Signed)
  Outpatient Audiology and [facility] [address] [place], [place]  [address] [phone]  AUDIOLOGICAL  EVALUATION  NAME: Donald Clark     DOB:   23-Jul-1960      MRN: [id number]                                                                                     DATE: 02/01/2024     REFERENT: Micheal Wolm ORN, MD STATUS: Outpatient DIAGNOSIS: Sensorineural Hearing Loss Bilateral    History: Donald Clark was seen for an audiological evaluation due to a rumbling sound and feeling in the left ear. He felt for a period of time the left ear had a decline in hearing. The tinnitus sounded like a large truck engine. It has since stopped. He had episodes of dizziness when going from sitting to standing.  Donald Clark denies pain, pressure, or tinnitus today.  Donald Clark has no history of hazardous noise exposure.  Medical history shows no additional risk for hearing loss.   Evaluation:  Otoscopy showed a clear view of the tympanic membranes, bilaterally Tympanometry results were consistent with normal middle a function, bilaterally. No indications of middle ear dysfunction. p Audiometric testing was completed using Conventional Audiometry techniques with insert earphones and supraural headphones. Test results are consistent with normal hearing sloping to a moderate sensorineural hearing loss bilaterally. Speech Recognition Thresholds were obtained at 15dB HL in the right ear and at 15dB HL in the left ear. Word Recognition Testing was completed at  40dB SL and Donald Clark scored 100% in each ear.    Results:  The test results were reviewed with Donald Clark. He has normal hearing sloping to a moderate sensorineural  hearing loss in both ears. Hearing loss is symmetric and high pitched. No indication of abnormal left ear function. Middle ear shows normal compliance bilaterally.  Audiogram printed and provided to [place].   Recommendations: Hearing aids recommended for both ears once ready for  daily use. Patient given list of local hearing aid providers and Otolaryngology offices.  Annual audiometric testing recommended to monitor hearing loss for progression.  Recommend seeing Otolaryngology if sound in left ear and moments of dizziness start again.     21 minutes spent testing and counseling on results.   If you have any questions please feel free to contact me at [phone].  Lauraine Ka Stalnaker Au.D.  Audiologist   02/01/2024  3:18 PM  Cc: Micheal Wolm ORN, MD

## 2024-02-10 ENCOUNTER — Encounter: Payer: Self-pay | Admitting: Dermatology

## 2024-02-10 ENCOUNTER — Ambulatory Visit: Admitting: Dermatology

## 2024-02-10 VITALS — BP 129/77 | HR 64

## 2024-02-10 DIAGNOSIS — C4491 Basal cell carcinoma of skin, unspecified: Secondary | ICD-10-CM

## 2024-02-10 DIAGNOSIS — C44519 Basal cell carcinoma of skin of other part of trunk: Secondary | ICD-10-CM

## 2024-02-10 NOTE — Progress Notes (Signed)
 Follow-Up Visit   Subjective  Donald Clark is a 63 y.o. male who presents for the following: Excision of a Superficial and Nodular Basal Cell Carcinoma on the left upper back, biopsied by Dr. Corey.   The following portions of the chart were reviewed this encounter and updated as appropriate: medications, allergies, medical history  Review of Systems:  No other skin or systemic complaints except as noted in HPI or Assessment and Plan.  Objective  Well appearing patient in no apparent distress; mood and affect are within normal limits.  A focused examination was performed of the following areas: Left upper back Relevant physical exam findings are noted in the Assessment and Plan.  Left Upper Back Biopsy scar   Assessment & Plan   BASAL CELL CARCINOMA (BCC), UNSPECIFIED SITE Left Upper Back - Skin excision  Excision method:  elliptical Lesion length (cm):  1.1 Lesion width (cm):  0.7 Margin per side (cm):  0.5 Total excision diameter (cm):  2.1 Informed consent: discussed and consent obtained   Timeout: patient name, date of birth, surgical site, and procedure verified   Procedure prep:  Patient was prepped and draped in usual sterile fashion Prep type:  Chlorhexidine  Anesthesia: the lesion was anesthetized in a standard fashion   Anesthetic:  1% lidocaine  w/ epinephrine  1-100,000 buffered w/ 8.4% NaHCO3 Instrument used: #15 blade   Hemostasis achieved with: suture, pressure and electrodesiccation   Hemostasis achieved with comment:  3.0 PDS with dermabond and steri strips Outcome: patient tolerated procedure well with no complications   Post-procedure details: sterile dressing applied and wound care instructions given   Additional details:  Final length 4.1  - Skin repair Complexity:  Complex Final length (cm):  4.1 Informed consent: discussed and consent obtained   Timeout: patient name, date of birth, surgical site, and procedure verified   Procedure prep:   Patient was prepped and draped in usual sterile fashion Prep type:  Chlorhexidine  Anesthesia: the lesion was anesthetized in a standard fashion   Anesthetic:  1% lidocaine  w/ epinephrine  1-100,000 buffered w/ 8.4% NaHCO3 Reason for type of repair: reduce tension to allow closure, preserve normal anatomy and preserve normal anatomical and functional relationships   Undermining: area extensively undermined   Subcutaneous layers (deep stitches):  Suture size:  3-0 Suture type: PDS (polydioxanone)   Stitches:  Buried vertical mattress Fine/surface layer approximation (top stitches):  Suture type: cyanoacrylate tissue glue   Hemostasis achieved with: suture, pressure and electrodesiccation Outcome: patient tolerated procedure well with no complications   Post-procedure details: sterile dressing applied and wound care instructions given    Specimen 1 - Surgical pathology Differential Diagnosis: BCC [id number] Check Margins: No  The surgical wound was then cleaned, prepped, and re-anesthetized as above. Wound edges were undermined extensively along at least one entire edge and at a distance equal to or greater than the width of the defect (see wound defect size above) in order to achieve closure and decrease wound tension and anatomic distortion. Redundant tissue repair including standing cone removal was performed. Hemostasis was achieved with electrocautery. Subcutaneous and epidermal tissues were approximated with the above sutures. The surgical site was then lightly scrubbed with sterile, saline-soaked gauze. Steri-strips were applied, and the area was then bandaged using Vaseline ointment, non-adherent gauze, gauze pads, and tape to provide an adequate pressure dressing. The patient tolerated the procedure well, was given detailed written and verbal wound care instructions, and was discharged in good condition.   The patient will follow-up:  6 months for TBSE.  Return in about 6 months  (around 08/10/2024) for TBSE.   Documentation: I have reviewed the above documentation for accuracy and completeness, and I agree with the above.  Donald CHRISTELLA HOLY, MD

## 2024-02-10 NOTE — Patient Instructions (Signed)

## 2024-02-14 ENCOUNTER — Ambulatory Visit: Payer: Self-pay | Admitting: Dermatology

## 2024-02-14 LAB — SURGICAL PATHOLOGY

## 2024-02-26 ENCOUNTER — Other Ambulatory Visit: Payer: Self-pay | Admitting: Family Medicine

## 2024-02-26 DIAGNOSIS — I10 Essential (primary) hypertension: Secondary | ICD-10-CM

## 2024-03-08 ENCOUNTER — Encounter: Admitting: Dermatology

## 2024-03-22 ENCOUNTER — Other Ambulatory Visit: Payer: Self-pay | Admitting: Family Medicine

## 2024-09-26 ENCOUNTER — Ambulatory Visit: Admitting: Dermatology
